# Patient Record
Sex: Male | Born: 1960 | Race: White | Hispanic: No | Marital: Married | Smoking: Former smoker
Health system: Southern US, Community
[De-identification: ages and names within clinical notes are randomized; demographics above are authoritative.]

## PROBLEM LIST (undated history)

## (undated) DIAGNOSIS — Z87898 Personal history of other specified conditions: Secondary | ICD-10-CM

## (undated) DIAGNOSIS — I10 Essential (primary) hypertension: Secondary | ICD-10-CM

## (undated) DIAGNOSIS — F988 Other specified behavioral and emotional disorders with onset usually occurring in childhood and adolescence: Secondary | ICD-10-CM

## (undated) DIAGNOSIS — M722 Plantar fascial fibromatosis: Secondary | ICD-10-CM

## (undated) DIAGNOSIS — N529 Male erectile dysfunction, unspecified: Secondary | ICD-10-CM

## (undated) DIAGNOSIS — R351 Nocturia: Secondary | ICD-10-CM

## (undated) DIAGNOSIS — Z8739 Personal history of other diseases of the musculoskeletal system and connective tissue: Secondary | ICD-10-CM

## (undated) DIAGNOSIS — Z8546 Personal history of malignant neoplasm of prostate: Secondary | ICD-10-CM

## (undated) DIAGNOSIS — F1020 Alcohol dependence, uncomplicated: Secondary | ICD-10-CM

## (undated) HISTORY — DX: Other specified behavioral and emotional disorders with onset usually occurring in childhood and adolescence: F98.8

## (undated) HISTORY — DX: Essential (primary) hypertension: I10

## (undated) HISTORY — DX: Alcohol dependence, uncomplicated: F10.20

---

## 1998-03-13 ENCOUNTER — Other Ambulatory Visit: Admission: RE | Admit: 1998-03-13 | Discharge: 1998-03-13 | Payer: Self-pay | Admitting: Gynecology

## 2004-03-04 ENCOUNTER — Emergency Department (HOSPITAL_COMMUNITY): Admission: EM | Admit: 2004-03-04 | Discharge: 2004-03-04 | Payer: Self-pay | Admitting: Emergency Medicine

## 2006-08-29 ENCOUNTER — Ambulatory Visit (HOSPITAL_COMMUNITY): Admission: RE | Admit: 2006-08-29 | Discharge: 2006-08-29 | Payer: Self-pay | Admitting: Urology

## 2006-09-07 ENCOUNTER — Encounter (HOSPITAL_COMMUNITY): Admission: RE | Admit: 2006-09-07 | Discharge: 2006-09-12 | Payer: Self-pay | Admitting: Urology

## 2006-10-11 ENCOUNTER — Encounter (INDEPENDENT_AMBULATORY_CARE_PROVIDER_SITE_OTHER): Payer: Self-pay | Admitting: Specialist

## 2006-10-11 ENCOUNTER — Inpatient Hospital Stay (HOSPITAL_COMMUNITY): Admission: RE | Admit: 2006-10-11 | Discharge: 2006-10-12 | Payer: Self-pay | Admitting: Urology

## 2006-10-11 HISTORY — PX: ROBOT ASSISTED LAPAROSCOPIC RADICAL PROSTATECTOMY: SHX5141

## 2006-10-20 ENCOUNTER — Ambulatory Visit: Payer: Self-pay | Admitting: Oncology

## 2006-10-31 LAB — COMPREHENSIVE METABOLIC PANEL
ALT: 29 U/L (ref 0–53)
AST: 18 U/L (ref 0–37)
Albumin: 4.5 g/dL (ref 3.5–5.2)
BUN: 16 mg/dL (ref 6–23)
CO2: 25 mEq/L (ref 19–32)
Calcium: 9.1 mg/dL (ref 8.4–10.5)
Chloride: 103 mEq/L (ref 96–112)
Potassium: 3.7 mEq/L (ref 3.5–5.3)

## 2006-10-31 LAB — CBC WITH DIFFERENTIAL/PLATELET
Basophils Absolute: 0.1 10*3/uL (ref 0.0–0.1)
EOS%: 5 % (ref 0.0–7.0)
HGB: 15.7 g/dL (ref 13.0–17.1)
MCH: 33.3 pg (ref 28.0–33.4)
MONO#: 0.8 10*3/uL (ref 0.1–0.9)
NEUT#: 3.8 10*3/uL (ref 1.5–6.5)
RDW: 12.4 % (ref 11.2–14.6)
WBC: 7 10*3/uL (ref 4.0–10.0)
lymph#: 2 10*3/uL (ref 0.9–3.3)

## 2006-12-19 ENCOUNTER — Ambulatory Visit (HOSPITAL_COMMUNITY): Admission: RE | Admit: 2006-12-19 | Discharge: 2006-12-19 | Payer: Self-pay | Admitting: Oncology

## 2006-12-21 ENCOUNTER — Ambulatory Visit: Payer: Self-pay | Admitting: Oncology

## 2006-12-26 LAB — CBC WITH DIFFERENTIAL/PLATELET
BASO%: 0.6 % (ref 0.0–2.0)
EOS%: 4.4 % (ref 0.0–7.0)
HCT: 44 % (ref 38.7–49.9)
LYMPH%: 31.5 % (ref 14.0–48.0)
MCH: 33.4 pg (ref 28.0–33.4)
MCHC: 36.1 g/dL — ABNORMAL HIGH (ref 32.0–35.9)
MONO%: 13.7 % — ABNORMAL HIGH (ref 0.0–13.0)
NEUT%: 49.8 % (ref 40.0–75.0)
Platelets: 207 10*3/uL (ref 145–400)

## 2006-12-26 LAB — COMPREHENSIVE METABOLIC PANEL
ALT: 31 U/L (ref 0–53)
AST: 25 U/L (ref 0–37)
Creatinine, Ser: 0.72 mg/dL (ref 0.40–1.50)
Total Bilirubin: 0.5 mg/dL (ref 0.3–1.2)

## 2006-12-26 LAB — LACTATE DEHYDROGENASE: LDH: 172 U/L (ref 94–250)

## 2007-02-06 ENCOUNTER — Ambulatory Visit: Payer: Self-pay | Admitting: Oncology

## 2007-04-04 ENCOUNTER — Ambulatory Visit: Payer: Self-pay | Admitting: Oncology

## 2007-04-16 ENCOUNTER — Ambulatory Visit (HOSPITAL_COMMUNITY): Admission: RE | Admit: 2007-04-16 | Discharge: 2007-04-16 | Payer: Self-pay | Admitting: Oncology

## 2007-04-26 LAB — CBC WITH DIFFERENTIAL/PLATELET
Basophils Absolute: 0 10*3/uL (ref 0.0–0.1)
Eosinophils Absolute: 0.2 10*3/uL (ref 0.0–0.5)
HGB: 14.4 g/dL (ref 13.0–17.1)
MCV: 94.6 fL (ref 81.6–98.0)
MONO%: 11.5 % (ref 0.0–13.0)
NEUT#: 3.4 10*3/uL (ref 1.5–6.5)
Platelets: 215 10*3/uL (ref 145–400)
RDW: 12.7 % (ref 11.2–14.6)

## 2007-04-26 LAB — LACTATE DEHYDROGENASE: LDH: 154 U/L (ref 94–250)

## 2007-04-26 LAB — COMPREHENSIVE METABOLIC PANEL
Albumin: 4.1 g/dL (ref 3.5–5.2)
Alkaline Phosphatase: 87 U/L (ref 39–117)
BUN: 15 mg/dL (ref 6–23)
CO2: 26 mEq/L (ref 19–32)
Calcium: 9.1 mg/dL (ref 8.4–10.5)
Glucose, Bld: 110 mg/dL — ABNORMAL HIGH (ref 70–99)
Potassium: 4.3 mEq/L (ref 3.5–5.3)

## 2007-10-01 ENCOUNTER — Ambulatory Visit (HOSPITAL_COMMUNITY): Admission: RE | Admit: 2007-10-01 | Discharge: 2007-10-01 | Payer: Self-pay | Admitting: Oncology

## 2007-10-03 ENCOUNTER — Ambulatory Visit: Payer: Self-pay | Admitting: Oncology

## 2010-10-24 ENCOUNTER — Encounter (HOSPITAL_COMMUNITY): Payer: Self-pay | Admitting: Oncology

## 2011-02-18 NOTE — Op Note (Signed)
NAMEJOVANIE, VERGE NO.:  [id number]   MEDICAL RECORD NO.:  [id number]          PATIENT TYPE:  INP   LOCATION:  1416                         FACILITY:  [facility]   PHYSICIAN:  Heloise Purpura, MD      DATE OF BIRTH:  May 06, 1961   DATE OF PROCEDURE:  10/11/2006  DATE OF DISCHARGE:                               OPERATIVE REPORT   PREOPERATIVE DIAGNOSIS:  Clinically localized adenocarcinoma of  prostate.   POSTOPERATIVE DIAGNOSIS:  Clinically localized adenocarcinoma of  prostate.   PROCEDURE:  1. Robotic-assisted laparoscopic radical prostatectomy (bilateral      nerve sparing).  2. Bilateral laparoscopic pelvic lymphadenectomy.   SURGEON:  Dr. Heloise Purpura   ASSISTANT:  Dr. Bjorn Pippin   ANESTHESIA:  General.   COMPLICATIONS:  None.   ESTIMATED BLOOD LOSS:  100 mL.   INTRAVENOUS FLUIDS:  1600 mL of lactated Ringer's.   SPECIMENS:  1. Prostate seminal vesicles.  2. Right pelvic lymph nodes.  3. Left pelvic lymph nodes.   DRAINS:  1. A 20-French coude catheter.  2. A #19 Blake pelvic drain.   INDICATION:  Mr. Poynter is a 50 year old gentleman with clinical  stage T1C prostate cancer with a PSA of 4.04 and Gleason score of 3+3 =  6.  On his initial evaluation by Dr. Logan Bores, a CT scan was performed  which did demonstrate some retroperitoneal lymphadenopathy.  He did  undergo further evaluation including a bone scan which was negative for  metastasis.  He also underwent a scrotal ultrasonography and testicular  tumor markers to exclude a possible testicular malignancy.  A  ProstaScint scan was also performed which was indeterminate.  After  discussing the overall situation and findings, the patient elected to  proceed with surgical therapy for his prostate cancer as it did appear  to be unlikely that he would have metastatic prostate cancer.  Potential  risks and benefits of the above procedures were discussed with the  patient, and he  consented.   DESCRIPTION OF PROCEDURE:  The patient was taken to the operating room,  and a general anesthetic was administered.  He was given preoperative  antibiotics, placed in the dorsal lithotomy position, and prepped and  draped in the usual sterile fashion.  Next, a preoperative time-out was  performed.  A Foley catheter was then inserted into the bladder.  A site  was selected 18 cm from the pubic symphysis and just to the left of the  umbilicus for placement of the camera port.  This was placed using a  standard open Hassan technique.  This allowed entry into the peritoneal  cavity under direct vision.  A 12 mm port was then placed, and a  pneumoperitoneum was established.  The 0-degree lens was then used to  inspect the abdomen, and there was no evidence of any intra-abdominal  injuries or other abnormalities.  The remaining ports were then placed.  Bilateral 8 mm robotic ports were placed 16 cm from the pubic symphysis  and 10 cm lateral to the camera port.  An additional 8 mm robotic port  was placed in the far  left lateral abdominal wall.  A 5 mm port was  placed between the camera port, right robotic port.  An additional 12 mm  port was placed in the far right lateral abdominal wall for laparoscopic  assistance.  All ports were placed under direct vision without  difficulty.  The surgical cart was then docked.  With the aid of the  cautery scissors, the bladder was reflected posteriorly allowing entry  in the space of Retzius and identification of the endopelvic fascia.  The endopelvic fascia was then incised from the apex back to the base of  the prostate bilaterally, and the underlying levator muscle fibers were  swept laterally off the prostate.  This helped to isolate the dorsal  venous complex which was then stapled and divided with a 45 mm flex ETS  stapler.  Attention then turned to the bladder neck which was identified  with the aid of Foley catheter manipulation.   The anterior bladder neck  was then incised, thereby exposing the catheter.  The catheter balloon  was deflated, and the catheter was brought into the operative field and  used to retract the prostate anteriorly.  This exposed the posterior  bladder neck which was divided.  Dissection then continued between the  bladder neck and the prostate until the vasa deferentia were identified.  During this dissection, there was noted to be a small opening on the  left posterior aspect of the bladder.  The vasa deferentia were then  identified and were dissected free, divided, and lifted anteriorly.  Seminal vesicles were also dissected free with care to control the  seminal vesicle arterial blood supply.  The seminal vesicles were also  lifted anteriorly.  The space between Denonvilliers fascia and the  anterior rectum was then bluntly developed, thereby isolating the  vascular pedicles of the prostate.  Attention then turned to the  anterior aspect of the prostate, and the lateral prostatic fascia was  incised bilaterally allowing the neurovascular bundles to be swept  laterally and posteriorly off the prostate.  The neurovascular bundles  were swept off the apex of the prostate.  The vascular pedicles were  then ligated with Hem-o-lok clips and sharply divided with cold scissor  dissection above the level of the neurovascular bundles.  This allowed  the prostate to be freed out to the apex.  The urethra was then sharply  divided, allowing the prostate to be disarticulated and placed up into  the abdomen for later removal.  The pelvis was then copiously irrigated,  and hemostasis was ensured.  With irrigation in the pelvis, air was  injected into the rectal catheter, and there was no evidence of a rectal  injury.  Attention then turned to the right pelvic sidewall.  The  fibrofatty tissue between the external iliac vein, confluence of the iliac vessels, obturator nerve, and Cooper's ligament was  dissected free  from the pelvic sidewall.  Hem-o-lok clips were used for lymphostasis  and hemostasis.  The fibrofatty tissue was then removed as a permanent  pathologic specimen.  An identical procedure was then performed on the  contralateral side.  Attention then returned to the pelvis.  The bladder  neck was carefully identified, and the area that had been inadvertently  opened on the posterior left portion of the bladder neck was identified.  The original bladder neck and this small opening were then connected  allowing there to be 1 larger bladder neck.  The ureteral orifices were  easily identified with  the aid of indigo carmine administration and were  well away from the bladder neck.  A 2-0 Vicryl slip-knot was then placed  at the 6 o'clock position between the bladder neck and urethra to  reapproximate these structures.  A double-armed 3-0 Monocryl suture was  then used to perform a 360 degree running anastomosis between the  bladder neck and urethra.  A new 20-French coude catheter was then  inserted into the bladder and irrigated.  The anastomosis appeared to be  watertight, and there were no blood clots within the bladder.  A #19  Blake drain was then brought through the left robotic port and  appropriately positioned in the pelvis.  It was secured to the skin with  a nylon suture.  The surgical cart was then undocked.  The Endopouch  retrieval bag was then used to retrieve the prostate specimen via the  periumbilical incision.  Zero Vicryl suture was then used to close the  right lateral 12 mm port site with the aid of the port closure suture  device.  All remaining ports were then removed under direct vision.  The  prostate specimen was then removed intact within the Endopouch retrieval  bag via the periumbilical port site.  This fascial opening was then  closed with a running 0 Vicryl suture.  All port sites were then  injected with 0.25% Marcaine and reapproximated at the  skin level with  staples.  Sterile dressings were applied.  The patient appeared to  tolerate the procedure well and without complications.  He was able to  be extubated and transferred to the recovery unit in satisfactory  condition.           ______________________________  Heloise Purpura, MD  Electronically Signed     LB/MEDQ  D:  10/11/2006  T:  10/11/2006  Job:  [id number]

## 2011-02-18 NOTE — Discharge Summary (Signed)
Adam Fox, Adam Fox NO.:  [id number]   MEDICAL RECORD NO.:  [id number]          PATIENT TYPE:  INP   LOCATION:  1416                         FACILITY:  [facility]   PHYSICIAN:  Heloise Purpura, MD      DATE OF BIRTH:  08/11/1961   DATE OF ADMISSION:  10/11/2006  DATE OF DISCHARGE:  10/12/2006                               DISCHARGE SUMMARY   ADMISSION DIAGNOSIS:  Prostate cancer.   DISCHARGE DIAGNOSIS:  Same.   HISTORY AND PHYSICAL:  For full details, please see admission history  and physical.  Briefly Adam Fox is a 50 year old gentleman with recently  diagnosed prostate cancer.  He was felt to most likely have clinically  localized prostate cancer, although did undergo a further workup based  on some incidentally retroperitoneal lymph nodes which were identified  on a CT scan.  He was not felt to have any clear evidence of metastatic  prostate cancer, and therefore elected to proceed with surgical therapy.   HOSPITAL COURSE:  On October 11, 2006, the patient was taken to the  operating room and underwent a robotic-assisted laparoscopic radical  prostatectomy and bilateral pelvic lymphadenectomy.  He tolerated this  procedure well and without complications.  Postoperatively, he was able  to be transferred to a regular hospital room following recovery from  anesthesia.  He was able to begin ambulating the night of surgery.  On  postoperative day 1, he began a clear liquid diet which he tolerated  without problems.  He was able to continue ambulating without  difficulty.  His labs indicated a stable hemoglobin.  In addition, he  maintained excellent urine output, with minimal output from his pelvic  drain.  Therefore, his drain was able to be removed.  He was able to be  discharged home in excellent condition on postoperative day 1.   DISPOSITION:  Home.   DISCHARGE MEDICATIONS:  1. He he was instructed to resume his regular home medications as      before  surgery, excepting any aspirin, nonsteroidal anti-      inflammatory drugs, or herbal supplements.  2. He was given a prescription to take Vicodin as needed for pain, and      told to use Colace as a stool softener.  3. He was also given a prescription to take Cipro beginning 1 day      prior to his return visit for Foley catheter removal.   DISCHARGE INSTRUCTIONS:  The patient was instructed to be ambulatory,  but told to specifically refrain from any heavy lifting, strenuous  activity, or driving.  He was given instructions on Foley catheter care  and a leg bag for daytime usage.   FOLLOWUP:  Adam Fox will follow-up in 1 week for removal of his Foley  catheter, as well as to review his surgical pathology in detail.  When  also will discuss further evaluation of his retroperitoneal  lymphadenopathy and likely will have him see medical oncology for this  further evaluation.           ______________________________  Heloise Purpura, MD  Electronically Signed     LB/MEDQ  D:  10/12/2006  T:  10/12/2006  Job:  [id number]

## 2011-02-18 NOTE — H&P (Signed)
NAMEANDRAY, ASSEFA NO.:  Fox   MEDICAL RECORD NO.:  [id number]          PATIENT TYPE:  INP   LOCATION:  NA                           FACILITY:  [facility]   PHYSICIAN:  Heloise Purpura, MD      DATE OF BIRTH:  May 18, 1961   DATE OF ADMISSION:  10/11/2006  DATE OF DISCHARGE:                              HISTORY & PHYSICAL   CHIEF COMPLAINT:  Prostate cancer.   HISTORY:  Adam Fox is a 50 year old gentleman who was found to have  clinical stage T1C  prostate cancer with a PSA of 4.04 and Gleason score  of 3 + 3 equals 6.  He did undergo a CT scan by Dr. Logan Bores, on his  initial workup, which did demonstrate some retroperitoneal  lymphadenopathy.  He had no evidence of bone metastasis on a subsequent  bone scan.  Due to his lymphadenopathy, he did undergo a further  evaluation including a scrotal ultrasound testicular tumor markers, Adam  well Adam examination by ProstaScint scan which was nonspecific.  Overall,  he was felt to unlikely have metastatic prostate cancer; and therefore  wished to proceed with surgical therapy for his prostate cancer,  understanding that he would need further evaluation of his  lymphadenopathy.   PAST MEDICAL HISTORY:  Hypertension.   PAST SURGICAL HISTORY:  None.   MEDICATIONS:  Hydrochlorothiazide.   ALLERGIES:  PENICILLIN.   SOCIAL HISTORY:  The patient does drink 5-10 beers per day.  He works Adam  a Music therapist.   FAMILY HISTORY:  The patient's father died at age 65.  His mother died  Adam a young woman in a car accident.   PHYSICAL EXAM:  CONSTITUTIONAL:  A well-nourished, well-developed age-  appropriate male in no acute distress.  CARDIOVASCULAR:  Regular rate and rhythm without obvious murmurs.  LUNGS:  Clear bilaterally.  ABDOMEN:  Soft, nontender, nondistended without abdominal masses.  LYMPH NODE SURVEY:  No cervical, supraclavicular, or inguinal  lymphadenopathy.  BACK:  No CVA tenderness.  DIGITAL RECTAL EXAMINATION:   Prostate is 30 grams without nodularity or  induration.  EXTREMITIES: No edema.   IMPRESSION:  Clinically localized prostate cancer.   PLAN:  Patient will undergo robotic assisted laparoscopic radical  prostatectomy and lymphadenectomy to further stage his disease.  He then  will be admitted to the hospital for routine postoperative care.           ______________________________  Heloise Purpura, MD  Electronically Signed     LB/MEDQ  D:  10/11/2006  T:  10/11/2006  Job:  [id number]

## 2012-01-06 ENCOUNTER — Ambulatory Visit (INDEPENDENT_AMBULATORY_CARE_PROVIDER_SITE_OTHER): Payer: BC Managed Care – PPO | Admitting: Physician Assistant

## 2012-01-06 VITALS — BP 135/89 | HR 78 | Temp 98.3°F | Resp 16 | Ht 76.0 in | Wt 233.0 lb

## 2012-01-06 DIAGNOSIS — J019 Acute sinusitis, unspecified: Secondary | ICD-10-CM

## 2012-01-06 DIAGNOSIS — R05 Cough: Secondary | ICD-10-CM

## 2012-01-06 DIAGNOSIS — F1010 Alcohol abuse, uncomplicated: Secondary | ICD-10-CM

## 2012-01-06 DIAGNOSIS — F1020 Alcohol dependence, uncomplicated: Secondary | ICD-10-CM

## 2012-01-06 DIAGNOSIS — I10 Essential (primary) hypertension: Secondary | ICD-10-CM

## 2012-01-06 MED ORDER — BENZONATATE 100 MG PO CAPS: 100.0000 mg | ORAL_CAPSULE | Freq: Three times a day (TID) | ORAL | Status: AC | PRN

## 2012-01-06 MED ORDER — AZITHROMYCIN 250 MG PO TABS: ORAL_TABLET | ORAL | Status: AC

## 2012-01-06 MED ORDER — ALPRAZOLAM 0.25 MG PO TABS: 0.2500 mg | ORAL_TABLET | Freq: Two times a day (BID) | ORAL | Status: AC | PRN

## 2012-01-06 MED ORDER — IPRATROPIUM BROMIDE 0.06 % NA SOLN: 2.0000 | Freq: Three times a day (TID) | NASAL | Status: DC

## 2012-01-06 NOTE — Progress Notes (Signed)
Patient ID: Adam Fox MRN: [id number], DOB: 1961-07-02, 51 y.o. Date of Encounter: 01/06/2012, 11:33 AM  Primary Physician: No primary provider on file.  Chief Complaint:  Chief Complaint  Patient presents with  . Nasal Congestion    x 2 wks  . Sore Throat    x 1 day    HPI: 51 y.o. year old male presents with a 14 day history of nasal congestion, post nasal drip, sinus pressure, and cough. He also mentions a 1 day history of sore throat. Afebrile. No chills. Nasal congestion thick and green/yellow. Sinus pressure is the worst symptom. Cough is productive secondary to post nasal drip and not associated with time of day. Ears feel full, leading to sensation of muffled hearing. Has tried OTC cold preps without success. No GI complaints. Appetite normal.  Also mentions that he plans to taper down his alcohol consumption. Currently he drinks about 12 beers per night and has been doing so for about the past 30 years. He understands this process must take some time and that he must taper his use.  No recent antibiotics, recent travels, or sick contacts   No leg trauma, sedentary periods, h/o cancer, or tobacco use.  Past Medical History  Diagnosis Date  . Alcoholism   . HTN (hypertension)      Home Meds: Prior to Admission medications   Medication Sig Start Date End Date Taking? Authorizing Provider  hydrochlorothiazide (HYDRODIURIL) 25 MG tablet Take 25 mg by mouth daily.   Yes Historical Provider, MD  ALPRAZolam (XANAX) 0.25 MG tablet Take 1 tablet (0.25 mg total) by mouth 2 (two) times daily as needed. 01/06/12 02/05/12  Raymon Mutton Azlyn Wingler, PA-C  azithromycin (ZITHROMAX Z-PAK) 250 MG tablet 2 tabs po first day, then 1 tab po next 4 days 01/06/12 01/11/12  Raymon Mutton Cleon Thoma, PA-C  benzonatate (TESSALON PERLES) 100 MG capsule Take 1 capsule (100 mg total) by mouth 3 (three) times daily as needed for cough. 01/06/12 01/13/12  Raymon Mutton Syriana Croslin, PA-C  ipratropium (ATROVENT) 0.06 % nasal spray Place 2  sprays into the nose 3 (three) times daily. 01/06/12 01/05/13  Sondra Barges, PA-C    Allergies:  Allergies  Allergen Reactions  . Penicillins     Childhood allergy     History   Social History  . Marital Status: Married    Spouse Name: N/A    Number of Children: N/A  . Years of Education: N/A   Occupational History  . Not on file.   Social History Main Topics  . Smoking status: Former Smoker    Quit date: 01/06/2003  . Smokeless tobacco: Not on file  . Alcohol Use: Not on file  . Drug Use: Not on file  . Sexually Active: Not on file   Other Topics Concern  . Not on file   Social History Narrative  . No narrative on file     Review of Systems: Constitutional: negative for chills, fever, night sweats or weight changes Cardiovascular: negative for chest pain or palpitations Respiratory: negative for hemoptysis, wheezing, or shortness of breath Abdominal: negative for abdominal pain, nausea, vomiting or diarrhea Dermatological: negative for rash Neurologic: negative for headache   Physical Exam: Blood pressure 135/89, pulse 78, temperature 98.3 F (36.8 C), temperature source Oral, resp. rate 16, height 6\' 4"  (1.93 m), weight 233 lb (105.688 kg)., Body mass index is 28.36 kg/(m^2). General: Well developed, well nourished, in no acute distress. Head: Normocephalic, atraumatic, eyes without discharge,  sclera non-icteric, nares are congested. Bilateral auditory canals clear, TM's are without perforation, pearly grey with reflective cone of light bilaterally. Serous effusion bilaterally behind TM's. Maxillary sinus TTP. Oral cavity moist, dentition normal. Posterior pharynx with post nasal drip and mild erythema. No peritonsillar abscess or tonsillar exudate. Neck: Supple. No thyromegaly. Full ROM. No lymphadenopathy. Lungs: Clear bilaterally to auscultation without wheezes, rales, or rhonchi. Breathing is unlabored.  Heart: RRR with S1 S2. No murmurs, rubs, or gallops  appreciated. Msk:  Strength and tone normal for age. Extremities: No clubbing or cyanosis. No edema. Neuro: Alert and oriented X 3. Moves all extremities spontaneously. CNII-XII grossly in tact. Psych:  Responds to questions appropriately with a normal affect.     ASSESSMENT AND PLAN:  51 y.o. year old male with sinusitis and alcoholism  1. Sinusitis -Azithromycin 250 MG #6 2 po first day then 1 po next 4 days no RF -Tessalon Perles 100 mg 1 po tid prn cough #30 no RF  -Atrovent NS 0.06% 2 sprays each nare bid prn #1 no RF -Mucinex -Tylenol/Motrin prn -Rest/fluids -RTC precautions -RTC 3-5 days if no improvement  2. Alcoholism  -Xanax 0.25 mg #60 1 po bid prn no RF SED -Taper alcohol use gradually, risks discussed in detail  Signed, Eula Listen, PA-C 01/06/2012 11:33 AM

## 2012-04-22 ENCOUNTER — Ambulatory Visit (INDEPENDENT_AMBULATORY_CARE_PROVIDER_SITE_OTHER): Payer: BC Managed Care – PPO | Admitting: Family Medicine

## 2012-04-22 VITALS — BP 132/80 | HR 16 | Temp 98.6°F | Resp 16 | Ht 74.5 in | Wt 230.0 lb

## 2012-04-22 DIAGNOSIS — M79673 Pain in unspecified foot: Secondary | ICD-10-CM

## 2012-04-22 DIAGNOSIS — M7989 Other specified soft tissue disorders: Secondary | ICD-10-CM

## 2012-04-22 DIAGNOSIS — M109 Gout, unspecified: Secondary | ICD-10-CM

## 2012-04-22 DIAGNOSIS — M79609 Pain in unspecified limb: Secondary | ICD-10-CM

## 2012-04-22 LAB — URIC ACID: Uric Acid, Serum: 7.5 mg/dL (ref 4.0–7.8)

## 2012-04-22 MED ORDER — COLCHICINE 0.6 MG PO TABS: 0.6000 mg | ORAL_TABLET | Freq: Every day | ORAL | Status: DC

## 2012-04-22 NOTE — Progress Notes (Signed)
51 year old White male is her today with complaints of left big toe pain. Pt states he has no history of Gout. Pt also has complaints of feet pain - plantar fascitis for the past five to six months. Pt states he has no other complaints.

## 2012-04-22 NOTE — Progress Notes (Addendum)
[facility]:  Office Visit  Chief Complaint:  Chief Complaint  Patient presents with  . Toe Pain    L big toe x last night  . Foot Pain    Both - Plantar Fasicitis? x 5-6 mths    HPI: Adam Fox is a 51 y.o. male who complains of  1 day h/o Left foot pain ( left 1st toe) and chronic right foot pain on PF. HE is on HCTZ. No h/o gout. Does drink about 10 beers per day. Does not consider himself an alcoholic. He does not eat other gout inducing foods.   Past Medical History  Diagnosis Date  . Alcoholism   . HTN (hypertension)    History reviewed. No pertinent past surgical history. History   Social History  . Marital Status: Married    Spouse Name: N/A    Number of Children: N/A  . Years of Education: N/A   Social History Main Topics  . Smoking status: Former Smoker    Quit date: 01/06/2003  . Smokeless tobacco: None  . Alcohol Use: Yes  . Drug Use: No  . Sexually Active: None   Other Topics Concern  . None   Social History Narrative  . None   No family history on file. Allergies  Allergen Reactions  . Penicillins     Childhood allergy    Prior to Admission medications   Medication Sig Start Date End Date Taking? Authorizing Provider  lisinopril-hydrochlorothiazide (PRINZIDE,ZESTORETIC) 20-12.5 MG per tablet Take 1 tablet by mouth daily.   Yes Historical Provider, MD  hydrochlorothiazide (HYDRODIURIL) 25 MG tablet Take 25 mg by mouth daily.    Historical Provider, MD  ipratropium (ATROVENT) 0.06 % nasal spray Place 2 sprays into the nose 3 (three) times daily. 01/06/12 01/05/13  Raymon Mutton Dunn, PA-C     ROS: The patient denies fevers, chills, night sweats, unintentional weight loss, chest pain, palpitations, wheezing, dyspnea on exertion, nausea, vomiting, abdominal pain, dysuria, hematuria, melena, numbness, weakness, or tingling. Denies loss of bowel or bladder function.   All other systems have been reviewed and were otherwise negative  with the exception of those mentioned in the HPI and as above.    PHYSICAL EXAM: Filed Vitals:   04/22/12 1114  BP: 132/80  Pulse: 16  Temp: 98.6 F (37 C)  Resp: 16   Filed Vitals:   04/22/12 1114  Height: 6' 2.5" (1.892 m)  Weight: 230 lb (104.327 kg)   Body mass index is 29.14 kg/(m^2).  General: Alert, no acute distress HEENT:  Normocephalic, atraumatic Cardiovascular:  No pedal edema. RRR, No MRG Respiratory:  No cyanosis, no use of accessory musculature. CTAB Skin: No rashes. Neurologic: Facial musculature symmetric. Psychiatric: Patient is appropriate throughout our interaction. Lymphatic: No cervical lymphadenopathy Musculoskeletal: Gait intact. Left 1st toe: 5/5 strength, + redness, sentation intact, ROM intact, + DP, + erythema, no warmth    LABS: Results for orders placed in visit on 04/04/07  CBC WITH DIFFERENTIAL      Component Value Range   WBC 5.9  4.0 - 10.0 10e3/uL   NEUT# 3.4  1.5 - 6.5 10e3/uL   HGB 14.4  13.0 - 17.1 g/dL   HCT 91.4  78.2 - 95.6 %   Platelets 215  145 - 400 10e3/uL   MCV 94.6  81.6 - 98.0 fL   MCH 34.0 (*) 28.0 - 33.4 pg   MCHC 36.0 (*) 32.0 - 35.9 g/dL   RBC 2.13  4.20 - 5.71 10e6/uL   RDW 12.7  11.2 - 14.6 %   lymph# 1.6  0.9 - 3.3 10e3/uL   MONO# 0.7  0.1 - 0.9 10e3/uL   Eosinophils Absolute 0.2  0.0 - 0.5 10e3/uL   Basophils Absolute 0.0  0.0 - 0.1 10e3/uL   NEUT% 58.0  40.0 - 75.0 %   LYMPH% 26.6  14.0 - 48.0 %   MONO% 11.5  0.0 - 13.0 %   EOS% 3.3  0.0 - 7.0 %   BASO% 0.6  0.0 - 2.0 %  COMPREHENSIVE METABOLIC PANEL      Component Value Range   Sodium 141  135 - 145 mEq/L   Potassium 4.3  3.5 - 5.3 mEq/L   Chloride 104  96 - 112 mEq/L   CO2 26  19 - 32 mEq/L   Glucose, Bld 110 (*) 70 - 99 mg/dL   BUN 15  6 - 23 mg/dL   Creatinine, Ser 5.78  0.40 - 1.50 mg/dL   Total Bilirubin 0.5  0.3 - 1.2 mg/dL   Alkaline Phosphatase 87  39 - 117 U/L   AST 21  0 - 37 U/L   ALT 31  0 - 53 U/L   Total Protein 7.0  6.0 - 8.3  g/dL   Albumin 4.1  3.5 - 5.2 g/dL   Calcium 9.1  8.4 - 46.9 mg/dL  LACTATE DEHYDROGENASE      Component Value Range   LDH 154  94 - 250 U/L     EKG/XRAY:   Primary read interpreted by Dr. Conley Rolls at [facility].   ASSESSMENT/PLAN: Encounter Diagnoses  Name Primary?  Marland Kitchen Foot pain/swelling   .    Marland Kitchen Gout Yes   1. ? Gout of left great toe Patient has no h/o of gout but is on HCTZ, will get uric acid level. If uric acid is elevated and he continues to have toe pain w/wout recurrence then will consider changing HCTZ Rx Colchicine 1.2 mg x 1 then 0.6 mg daily until pain resolves and/or get nausea/vomiting, diarrhea sxs.  Uric acid lab pending     Adam Blomquist PHUONG, DO 04/22/2012 11:43 AM

## 2012-04-23 ENCOUNTER — Encounter: Payer: Self-pay | Admitting: Family Medicine

## 2014-01-10 ENCOUNTER — Other Ambulatory Visit: Payer: Self-pay | Admitting: Urology

## 2014-01-13 ENCOUNTER — Other Ambulatory Visit: Payer: Self-pay | Admitting: Urology

## 2014-01-24 ENCOUNTER — Encounter (HOSPITAL_BASED_OUTPATIENT_CLINIC_OR_DEPARTMENT_OTHER): Payer: Self-pay | Admitting: *Deleted

## 2014-01-24 NOTE — Progress Notes (Signed)
NPO AFTER MN. ARRIVE AT 0600.  NEEDS ISTAT AND EKG.  PT ADVISED TO ABSTAIN FROM MARIJUANA AND CUT BACK ON ALCOHOL.

## 2014-01-27 ENCOUNTER — Encounter (HOSPITAL_BASED_OUTPATIENT_CLINIC_OR_DEPARTMENT_OTHER)
Admission: RE | Disposition: A | Discharge status: Home / Self Care | Payer: Self-pay | Source: Ambulatory Visit | Attending: Urology

## 2014-01-27 ENCOUNTER — Ambulatory Visit (HOSPITAL_BASED_OUTPATIENT_CLINIC_OR_DEPARTMENT_OTHER)
Admission: RE | Admit: 2014-01-27 | Discharge: 2014-01-28 | Disposition: A | Discharge status: Home / Self Care | Payer: No Typology Code available for payment source | Source: Ambulatory Visit | Attending: Urology | Admitting: Urology

## 2014-01-27 ENCOUNTER — Encounter (HOSPITAL_BASED_OUTPATIENT_CLINIC_OR_DEPARTMENT_OTHER): Payer: Self-pay | Admitting: *Deleted

## 2014-01-27 ENCOUNTER — Ambulatory Visit (HOSPITAL_BASED_OUTPATIENT_CLINIC_OR_DEPARTMENT_OTHER): Payer: No Typology Code available for payment source | Admitting: Anesthesiology

## 2014-01-27 ENCOUNTER — Other Ambulatory Visit: Payer: Self-pay

## 2014-01-27 ENCOUNTER — Encounter (HOSPITAL_BASED_OUTPATIENT_CLINIC_OR_DEPARTMENT_OTHER): Payer: No Typology Code available for payment source | Admitting: Anesthesiology

## 2014-01-27 DIAGNOSIS — Z79899 Other long term (current) drug therapy: Secondary | ICD-10-CM | POA: Insufficient documentation

## 2014-01-27 DIAGNOSIS — Z87891 Personal history of nicotine dependence: Secondary | ICD-10-CM | POA: Insufficient documentation

## 2014-01-27 DIAGNOSIS — N529 Male erectile dysfunction, unspecified: Secondary | ICD-10-CM | POA: Insufficient documentation

## 2014-01-27 DIAGNOSIS — Z8546 Personal history of malignant neoplasm of prostate: Secondary | ICD-10-CM | POA: Insufficient documentation

## 2014-01-27 DIAGNOSIS — N5231 Erectile dysfunction following radical prostatectomy: Secondary | ICD-10-CM

## 2014-01-27 DIAGNOSIS — Z880 Allergy status to penicillin: Secondary | ICD-10-CM | POA: Insufficient documentation

## 2014-01-27 DIAGNOSIS — I10 Essential (primary) hypertension: Secondary | ICD-10-CM | POA: Insufficient documentation

## 2014-01-27 DIAGNOSIS — Z9079 Acquired absence of other genital organ(s): Secondary | ICD-10-CM | POA: Insufficient documentation

## 2014-01-27 HISTORY — DX: Personal history of malignant neoplasm of prostate: Z85.46

## 2014-01-27 HISTORY — DX: Personal history of other diseases of the musculoskeletal system and connective tissue: Z87.39

## 2014-01-27 HISTORY — DX: Male erectile dysfunction, unspecified: N52.9

## 2014-01-27 HISTORY — DX: Plantar fascial fibromatosis: M72.2

## 2014-01-27 HISTORY — PX: PENILE PROSTHESIS IMPLANT: SHX240

## 2014-01-27 HISTORY — DX: Personal history of other specified conditions: Z87.898

## 2014-01-27 HISTORY — DX: Nocturia: R35.1

## 2014-01-27 LAB — POCT I-STAT, CHEM 8
BUN: 14 mg/dL (ref 6–23)
Calcium, Ion: 1.2 mmol/L (ref 1.12–1.23)
Chloride: 105 mEq/L (ref 96–112)
Creatinine, Ser: 0.8 mg/dL (ref 0.50–1.35)
Glucose, Bld: 107 mg/dL — ABNORMAL HIGH (ref 70–99)
HCT: 43 % (ref 39.0–52.0)
Hemoglobin: 14.6 g/dL (ref 13.0–17.0)
Potassium: 3.8 mEq/L (ref 3.7–5.3)
Sodium: 138 mEq/L (ref 137–147)
TCO2: 25 mmol/L (ref 0–100)

## 2014-01-27 SURGERY — INSERTION, PENILE PROSTHESIS, INFLATABLE
Anesthesia: General | Site: Penis

## 2014-01-27 MED ORDER — HYDROMORPHONE HCL PF 1 MG/ML IJ SOLN
INTRAMUSCULAR | Status: AC
  Filled 2014-01-27: qty 1

## 2014-01-27 MED ORDER — OXYCODONE HCL 5 MG PO TABS
5.0000 mg | ORAL_TABLET | ORAL | Status: DC | PRN
  Administered 2014-01-27 (×2): 5 mg via ORAL
  Filled 2014-01-27: qty 1

## 2014-01-27 MED ORDER — LACTATED RINGERS IV SOLN
INTRAVENOUS | Status: DC
  Administered 2014-01-27: 07:00:00 via INTRAVENOUS
  Filled 2014-01-27: qty 1000

## 2014-01-27 MED ORDER — CEFAZOLIN SODIUM-DEXTROSE 2-3 GM-% IV SOLR
2.0000 g | Freq: Three times a day (TID) | INTRAVENOUS | Status: DC
  Administered 2014-01-27 – 2014-01-28 (×3): 2 g via INTRAVENOUS
  Filled 2014-01-27: qty 50

## 2014-01-27 MED ORDER — CIPROFLOXACIN HCL 500 MG PO TABS: 500.0000 mg | ORAL_TABLET | Freq: Two times a day (BID) | ORAL | Status: DC

## 2014-01-27 MED ORDER — PROMETHAZINE HCL 25 MG/ML IJ SOLN
6.2500 mg | INTRAMUSCULAR | Status: DC | PRN
  Filled 2014-01-27: qty 1

## 2014-01-27 MED ORDER — ACETAMINOPHEN 10 MG/ML IV SOLN
INTRAVENOUS | Status: DC | PRN
  Administered 2014-01-27: 1000 mg via INTRAVENOUS

## 2014-01-27 MED ORDER — OXYCODONE HCL 5 MG PO TABS
ORAL_TABLET | ORAL | Status: AC
  Filled 2014-01-27: qty 1

## 2014-01-27 MED ORDER — BUPIVACAINE-EPINEPHRINE 0.5% -1:200000 IJ SOLN
INTRAMUSCULAR | Status: DC | PRN
  Administered 2014-01-27: 10 mL

## 2014-01-27 MED ORDER — FENTANYL CITRATE 0.05 MG/ML IJ SOLN
INTRAMUSCULAR | Status: DC | PRN
  Administered 2014-01-27: 50 ug via INTRAVENOUS
  Administered 2014-01-27: 25 ug via INTRAVENOUS
  Administered 2014-01-27 (×2): 50 ug via INTRAVENOUS
  Administered 2014-01-27: 25 ug via INTRAVENOUS

## 2014-01-27 MED ORDER — HYDROMORPHONE HCL PF 1 MG/ML IJ SOLN
0.5000 mg | INTRAMUSCULAR | Status: DC | PRN
  Administered 2014-01-27: 0.5 mg via INTRAVENOUS
  Administered 2014-01-27: 1 mg via INTRAVENOUS
  Administered 2014-01-27: 0.5 mg via INTRAVENOUS
  Administered 2014-01-28: 1 mg via INTRAVENOUS
  Filled 2014-01-27: qty 1

## 2014-01-27 MED ORDER — LIDOCAINE HCL (CARDIAC) 20 MG/ML IV SOLN
INTRAVENOUS | Status: DC | PRN
  Administered 2014-01-27: 100 mg via INTRAVENOUS

## 2014-01-27 MED ORDER — SODIUM CHLORIDE 0.9 % IR SOLN
Status: DC | PRN
  Administered 2014-01-27: 08:00:00

## 2014-01-27 MED ORDER — PROPOFOL 10 MG/ML IV BOLUS
INTRAVENOUS | Status: DC | PRN
  Administered 2014-01-27: 300 mg via INTRAVENOUS

## 2014-01-27 MED ORDER — MIDAZOLAM HCL 5 MG/5ML IJ SOLN
INTRAMUSCULAR | Status: DC | PRN
  Administered 2014-01-27: 2 mg via INTRAVENOUS

## 2014-01-27 MED ORDER — ACETAMINOPHEN 10 MG/ML IV SOLN
1000.0000 mg | Freq: Four times a day (QID) | INTRAVENOUS | Status: AC
  Administered 2014-01-27 – 2014-01-28 (×4): 1000 mg via INTRAVENOUS
  Filled 2014-01-27: qty 100

## 2014-01-27 MED ORDER — ONDANSETRON HCL 4 MG/2ML IJ SOLN
INTRAMUSCULAR | Status: DC | PRN
  Administered 2014-01-27: 4 mg via INTRAVENOUS

## 2014-01-27 MED ORDER — VANCOMYCIN HCL IN DEXTROSE 1-5 GM/200ML-% IV SOLN
1000.0000 mg | Freq: Once | INTRAVENOUS | Status: AC
  Administered 2014-01-27: 1000 mg via INTRAVENOUS
  Filled 2014-01-27: qty 200

## 2014-01-27 MED ORDER — DEXAMETHASONE SODIUM PHOSPHATE 4 MG/ML IJ SOLN
INTRAMUSCULAR | Status: DC | PRN
  Administered 2014-01-27: 10 mg via INTRAVENOUS

## 2014-01-27 MED ORDER — MIDAZOLAM HCL 2 MG/2ML IJ SOLN
INTRAMUSCULAR | Status: AC
  Filled 2014-01-27: qty 2

## 2014-01-27 MED ORDER — FENTANYL CITRATE 0.05 MG/ML IJ SOLN
INTRAMUSCULAR | Status: AC
  Filled 2014-01-27: qty 4

## 2014-01-27 MED ORDER — GENTAMICIN SULFATE 40 MG/ML IJ SOLN
460.0000 mg | INTRAVENOUS | Status: AC
  Administered 2014-01-27: 460 mg via INTRAVENOUS
  Filled 2014-01-27 (×2): qty 11.5

## 2014-01-27 MED ORDER — ONDANSETRON HCL 4 MG/2ML IJ SOLN
4.0000 mg | INTRAMUSCULAR | Status: DC | PRN
  Filled 2014-01-27: qty 2

## 2014-01-27 MED ORDER — SODIUM CHLORIDE 0.9 % IV SOLN
INTRAVENOUS | Status: DC
  Administered 2014-01-27 – 2014-01-28 (×2): via INTRAVENOUS
  Filled 2014-01-27: qty 1000

## 2014-01-27 MED ORDER — HYDROMORPHONE HCL PF 1 MG/ML IJ SOLN
0.2500 mg | INTRAMUSCULAR | Status: DC | PRN
  Filled 2014-01-27: qty 1

## 2014-01-27 MED ORDER — BACITRACIN-NEOMYCIN-POLYMYXIN OINTMENT TUBE
TOPICAL_OINTMENT | CUTANEOUS | Status: DC | PRN
  Administered 2014-01-27: 1 via TOPICAL

## 2014-01-27 MED ORDER — HYDROMORPHONE HCL 4 MG PO TABS: 4.0000 mg | ORAL_TABLET | ORAL | Status: DC | PRN

## 2014-01-27 SURGICAL SUPPLY — 78 items
ADH SKN CLS APL DERMABOND .7 (GAUZE/BANDAGES/DRESSINGS)
APL SKNCLS STERI-STRIP NONHPOA (GAUZE/BANDAGES/DRESSINGS) ×1
APPLICATOR COTTON TIP 6IN STRL (MISCELLANEOUS) IMPLANT
BAG DECANTER FOR FLEXI CONT (MISCELLANEOUS) ×1 IMPLANT
BAG URINE DRAINAGE (UROLOGICAL SUPPLIES) ×2 IMPLANT
BANDAGE CO FLEX L/F 2IN X 5YD (GAUZE/BANDAGES/DRESSINGS) IMPLANT
BANDAGE GAUZE ELAST BULKY 4 IN (GAUZE/BANDAGES/DRESSINGS) ×1 IMPLANT
BENZOIN TINCTURE PRP APPL 2/3 (GAUZE/BANDAGES/DRESSINGS) ×2 IMPLANT
BLADE HEX COATED 2.75 (ELECTRODE) ×2 IMPLANT
BLADE SURG 15 STRL LF DISP TIS (BLADE) ×2 IMPLANT
BLADE SURG 15 STRL SS (BLADE) ×4
BLADE SURG ROTATE 9660 (MISCELLANEOUS) ×1 IMPLANT
BNDG GAUZE ELAST 4 BULKY (GAUZE/BANDAGES/DRESSINGS) ×2 IMPLANT
CANISTER SUCTION 1200CC (MISCELLANEOUS) IMPLANT
CANISTER SUCTION 2500CC (MISCELLANEOUS) ×1 IMPLANT
CATH FOLEY 2WAY SLVR  5CC 16FR (CATHETERS) ×1
CATH FOLEY 2WAY SLVR 5CC 16FR (CATHETERS) ×1 IMPLANT
CHLORAPREP W/TINT 26ML (MISCELLANEOUS) ×2 IMPLANT
CLOTH BEACON ORANGE TIMEOUT ST (SAFETY) ×2 IMPLANT
COVER MAYO STAND STRL (DRAPES) ×4 IMPLANT
COVER TABLE BACK 60X90 (DRAPES) ×2 IMPLANT
DERMABOND ADVANCED (GAUZE/BANDAGES/DRESSINGS)
DERMABOND ADVANCED .7 DNX12 (GAUZE/BANDAGES/DRESSINGS) IMPLANT
DISSECTOR ROUND CHERRY 3/8 STR (MISCELLANEOUS) IMPLANT
DRAPE EXTREMITY T 121X128X90 (DRAPE) ×2 IMPLANT
DRAPE LAPAROTOMY TRNSV 102X78 (DRAPE) IMPLANT
DRSG TEGADERM 4X4.75 (GAUZE/BANDAGES/DRESSINGS) ×2 IMPLANT
ELECT REM PT RETURN 9FT ADLT (ELECTROSURGICAL) ×2
ELECTRODE REM PT RTRN 9FT ADLT (ELECTROSURGICAL) ×1 IMPLANT
GAUZE SPONGE 4X4 12PLY STRL LF (GAUZE/BANDAGES/DRESSINGS) ×2 IMPLANT
GLOVE BIO SURGEON STRL SZ 6.5 (GLOVE) ×1 IMPLANT
GLOVE BIO SURGEON STRL SZ8 (GLOVE) ×3 IMPLANT
GLOVE INDICATOR 7.0 STRL GRN (GLOVE) ×1 IMPLANT
GLOVE SURG SS PI 7.5 STRL IVOR (GLOVE) ×2 IMPLANT
GOWN STRL REUS W/ TWL LRG LVL3 (GOWN DISPOSABLE) IMPLANT
GOWN STRL REUS W/ TWL XL LVL3 (GOWN DISPOSABLE) IMPLANT
GOWN STRL REUS W/TWL LRG LVL3 (GOWN DISPOSABLE) ×2
GOWN STRL REUS W/TWL XL LVL3 (GOWN DISPOSABLE) ×2
HOLDER FOLEY CATH W/STRAP (MISCELLANEOUS) ×2 IMPLANT
IV NS 1000ML (IV SOLUTION) ×2
IV NS 1000ML BAXH (IV SOLUTION) IMPLANT
KIT TITAN ASSEMBLY (Erectile Restoration) ×1 IMPLANT
KIT TITAN ASSEMBLY STANDARD (Erectile Restoration) ×1 IMPLANT
KIT TITAN ASSEMBLY STD (Erectile Restoration) IMPLANT
NS IRRIG 500ML POUR BTL (IV SOLUTION) ×2 IMPLANT
PACK BASIN DAY SURGERY FS (CUSTOM PROCEDURE TRAY) ×2 IMPLANT
PENCIL BUTTON HOLSTER BLD 10FT (ELECTRODE) ×2 IMPLANT
PLUG CATH AND CAP STER (CATHETERS) ×2 IMPLANT
PROS TITAN SCROT 0 ANG 22CM (Erectile Restoration) ×2 IMPLANT
PROSTHESIS TTN SCRO 0 ANG 22CM (Erectile Restoration) IMPLANT
RESERVOIR 75CC LOCKOUT BIOFLEX (Erectile Restoration) ×1 IMPLANT
RETRACTOR WILSON COLOPLAST (INSTRUMENTS) ×1 IMPLANT
RETRACTOR WILSON SYSTEM (INSTRUMENTS) ×1 IMPLANT
SPONGE GAUZE 4X4 12PLY (GAUZE/BANDAGES/DRESSINGS) ×1 IMPLANT
SPONGE LAP 18X18 X RAY DECT (DISPOSABLE) IMPLANT
SPONGE LAP 4X18 X RAY DECT (DISPOSABLE) ×4 IMPLANT
STRIP CLOSURE SKIN 1/2X4 (GAUZE/BANDAGES/DRESSINGS) IMPLANT
SUPPORT SCROTAL LG STRP (MISCELLANEOUS) ×1 IMPLANT
SURGILUBE 2OZ TUBE FLIPTOP (MISCELLANEOUS) ×2 IMPLANT
SUT CHROMIC 3 0 SH 27 (SUTURE) ×5 IMPLANT
SUT MNCRL AB 4-0 PS2 18 (SUTURE) ×2 IMPLANT
SUT PDS AB 2-0 CT2 27 (SUTURE) ×4 IMPLANT
SUT VIC AB 2-0 UR6 27 (SUTURE) IMPLANT
SUT VIC AB 3-0 SH 27 (SUTURE)
SUT VIC AB 3-0 SH 27X BRD (SUTURE) IMPLANT
SUT VICRYL 0 UR6 27IN ABS (SUTURE) ×4 IMPLANT
SUT VICRYL 4-0 PS2 18IN ABS (SUTURE) IMPLANT
SYR 20CC LL (SYRINGE) ×2 IMPLANT
SYR 50ML LL SCALE MARK (SYRINGE) ×4 IMPLANT
SYR BULB IRRIGATION 50ML (SYRINGE) ×2 IMPLANT
SYRINGE 10CC LL (SYRINGE) ×2 IMPLANT
TLC MALE UROLOGY RETRACTOR SYSTEM ×1 IMPLANT
TOWEL OR 17X24 6PK STRL BLUE (TOWEL DISPOSABLE) ×4 IMPLANT
TRAY DSU PREP LF (CUSTOM PROCEDURE TRAY) ×2 IMPLANT
TUBE CONNECTING 12X1/4 (SUCTIONS) ×2 IMPLANT
WATER STERILE IRR 3000ML UROMA (IV SOLUTION) IMPLANT
WATER STERILE IRR 500ML POUR (IV SOLUTION) ×2 IMPLANT
YANKAUER SUCT BULB TIP NO VENT (SUCTIONS) ×2 IMPLANT

## 2014-01-27 NOTE — Anesthesia Preprocedure Evaluation (Addendum)
Anesthesia Evaluation  Patient identified by MRN, date of birth, ID band Patient awake    Reviewed: Allergy & Precautions, H&P , NPO status , Patient's Chart, lab work & pertinent test results  Airway Mallampati: II TM Distance: <3 FB Neck ROM: Full    Dental no notable dental hx.    Pulmonary neg pulmonary ROS, former smoker,  breath sounds clear to auscultation  Pulmonary exam normal       Cardiovascular hypertension, Pt. on medications Rhythm:Regular Rate:Normal     Neuro/Psych negative neurological ROS  negative psych ROS   GI/Hepatic negative GI ROS, (+)     substance abuse  alcohol use,   Endo/Other  negative endocrine ROS  Renal/GU negative Renal ROS  negative genitourinary   Musculoskeletal negative musculoskeletal ROS (+)   Abdominal   Peds negative pediatric ROS (+)  Hematology negative hematology ROS (+)   Anesthesia Other Findings   Reproductive/Obstetrics negative OB ROS                           Anesthesia Physical Anesthesia Plan  ASA: III  Anesthesia Plan: General   Post-op Pain Management:    Induction: Intravenous  Airway Management Planned: LMA  Additional Equipment:   Intra-op Plan:   Post-operative Plan: Extubation in OR  Informed Consent: I have reviewed the patients History and Physical, chart, labs and discussed the procedure including the risks, benefits and alternatives for the proposed anesthesia with the patient or authorized representative who has indicated his/her understanding and acceptance.   Dental advisory given  Plan Discussed with: CRNA and Surgeon  Anesthesia Plan Comments:         Anesthesia Quick Evaluation

## 2014-01-27 NOTE — Transfer of Care (Signed)
Immediate Anesthesia Transfer of Care Note  Patient: Adam Fox  Procedure(s) Performed: Procedure(s): 3 PIECE PENILE PROTHESIS INFLATABLE COLOPLAST (N/A)  Patient Location: PACU  Anesthesia Type:General  Level of Consciousness: awake, alert  and oriented  Airway & Oxygen Therapy: Patient Spontanous Breathing and Patient connected to nasal cannula oxygen  Post-op Assessment: Report given to PACU RN  Post vital signs: Reviewed and stable  Complications: No apparent anesthesia complications

## 2014-01-27 NOTE — H&P (Signed)
[name] is a 53 year old male with erectile dysfunction.   History of Present Illness        Adam Fox is a 53 year old with prostate cancer s/p a BNS RAL radical prostatectomy on October 11, 2006. His PSA has thus far been undetectable.      TNM stage: pT2c N0 Mx   Gleason score: 3+3=6   Surgical margins: Negative   Pretreatment PSA: 4.04   Pretreatment SHIM: 24      Interval history: After his prostatectomy he developed significant erectile dysfunction. He has tried Viagra, Cialis for daily use and several combinations of injection therapy which caused pain but no significant erection.  He had no new medical or surgical problems since I have seen him last.   Past Medical History Problems  1. History of hypertension (V12.59) 2. Prostate cancer (185)  Surgical History Problems  1. History of Prostatect Retropubic Radical W/ Nerve Sparing Laparoscopic  Current Meds 1. Flax Seed Oil CAPS;  Therapy: (Recorded:16Mar2015) to Recorded 2. Ibuprofen TABS; TAKE TABLET  PRN;  Therapy: (Recorded:16Mar2015) to Recorded 3. Lisinopril-Hydrochlorothiazide 20-12.5 MG Oral Tablet;  Therapy: (Recorded:16Mar2015) to Recorded  Allergies Medication  1. Penicillins  Family History Problems  1. Denied: Family history of Prostate Cancer  Social History Problems  1. Alcohol Use   5-10 beers per day 2. Marital History - Currently Married 3. Occupation:   Carpenter 4. Tobacco Use (V15.82)   quit smoking 7 yrs ago  Review of Systems Genitourinary, constitutional, skin, eye, otolaryngeal, hematologic/lymphatic, cardiovascular, pulmonary, endocrine, musculoskeletal, gastrointestinal, neurological and psychiatric system(s) were reviewed and pertinent findings if present are noted.  Genitourinary: Erectile dysfunction  Vitals Vital Signs  Height: 6 ft 2.75 in Weight: 248 lb  BMI Calculated: 31.21 BSA Calculated: 2.4 Blood Pressure: 133 / 78 Temperature: 97.8 F Heart  Rate: 80  Physical Exam Constitutional: Well nourished and well developed . No acute distress.  ENT:. The ears and nose are normal in appearance.  Neck: The appearance of the neck is normal and no neck mass is present.  Pulmonary: No respiratory distress and normal respiratory rhythm and effort.  Cardiovascular: Heart rate and rhythm are normal . No peripheral edema.  Abdomen: The abdomen is soft and nontender. No masses are palpated. No CVA tenderness. No hernias are palpable. No hepatosplenomegaly noted.   Genitourinary: Examination of the penis demonstrates no discharge, no masses, no lesions and a normal meatus. The penis is uncircumcised. The scrotum is without lesions. The right epididymis is palpably normal and non-tender. The left epididymis is palpably normal and non-tender. The right testis is non-tender and without masses. The left testis is non-tender and without masses.  Lymphatics: The femoral and inguinal nodes are not enlarged or tender.  Skin: Normal skin turgor, no visible rash and no visible skin lesions.  Neuro/Psych:. Mood and affect are appropriate.  Assessment      I discussed with him penile prosthesis placement as his last remaining option. We discussed how the device worked in the fact that it is sized to his current corporal length. We discussed the incision used and the fact that a second incision in the suprapubic region may be necessary. I went over the risks and complications associated with this surgery, the probability of success and the need for an overnight stay in the hospital after the surgery as well as the anticipated postoperative course. I have answered all of his questions to his satisfaction. I've also given he was given written information from [facility] including  a DVD at his last visit. He is aware that he device become infected this will very likely result in the need for its entirely normal. He has elected to proceed with three-piece inflatable penile  prosthesis implantation.   Plan  He will schedule for three-piece inflatable penile prosthesis implantation via the penoscrotal approach.

## 2014-01-27 NOTE — Anesthesia Postprocedure Evaluation (Signed)
  Anesthesia Post-op Note  Patient: Adam Fox  Procedure(s) Performed: Procedure(s) (LRB): 3 PIECE PENILE PROTHESIS INFLATABLE COLOPLAST (N/A)  Patient Location: PACU  Anesthesia Type: General  Level of Consciousness: awake and alert   Airway and Oxygen Therapy: Patient Spontanous Breathing  Post-op Pain: mild  Post-op Assessment: Post-op Vital signs reviewed, Patient's Cardiovascular Status Stable, Respiratory Function Stable, Patent Airway and No signs of Nausea or vomiting  Last Vitals:  Filed Vitals:   01/27/14 0936  BP: 160/93  Pulse: 92  Temp: 36.5 C  Resp: 12    Post-op Vital Signs: stable   Complications: No apparent anesthesia complications

## 2014-01-27 NOTE — Op Note (Signed)
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 He has elected to proceed with prosthesis implantation.  ANESTHESIA:  General  EBL:  Minimal  Device: 3 piece Titan: 75 cc reservoir, 22 cm cylinders and 2 cm rear-tip extenders on right and left sides  LOCAL MEDICATIONS USED:  1/2% Marcaine with epinephrine in the scrotal incision.  SPECIMEN: None  DISPOSITION OF SPECIMEN:  N/A  Description of procedure: The patient was taken to the major operating room, placed on the table and administered general anesthesia in the supine position. His genitalia was then scrubbed for 10 minutes, painted and then sterilely draped. An official timeout was then performed. He received preoperative gentamicin and cephalexin IV.  A 16 French Foley catheter was placed in the bladder and the bladder was drained and the catheter was plugged. A midline median raphae scrotal incision was then made and then blunt dissection was used to expose the corpus cavernosum on the right and left sides. This was further cleared of overlying tissue using sharp technique. 2-0 PDS sutures were then placed in the corpus cavernosum to service stay sutures and then an incision was then made in the corpus cavernosum first on the left-hand side with the knife and then extended proximally and distally with the curved Mayo scissors. I then dilated the corpus cavernosum with the Hegar dilators starting at 10 and progressing to 14 proximally and distally. I then irrigated the corpus cavernosum with antibiotic solution and measured the distance proximally and  distally from the stay suture and was found to be 14 cm distally and 10 cm proximally. I then turned my attention to the contralateral corpus cavernosum and placed my stay sutures, made my corporotomy and dilated the corpus cavernosum in an identical fashion. This was measured and also was found to be 14 centimeters distally and 10 cm proximally. It was irrigated with antibiotic solution as was the scrotum. I then chose an 22 cm cylinder set with 2 cm rear-tip extenders and these were prepped while I prepared the site for reservoir placement.  I used blunt technique to dissect up to the external inguinal ring on the right-hand side after the bladder had been completely drained.  I swept the spermatic cord laterally and then was able to bluntly dissect along the floor of the external inguinal ring and then cephalad lateral to the rectus muscle and beneath the transversalis muscle. I was able to palpate tissue beneath my finger in the peritoneal cavity at all times. A nasal speculum was then in situ weighted next my finger and this was used to guide the reservoir into the developed for it. I then filled the reservoir with 75 cc of sterile saline with no back pressure noted.  Attention was redirected to the corporotomies where the cylinders were then placed by first fixing the suture to the distal aspect of the right cylinder to a straight needle. This was then loaded on the [facility] inserter and passed through the corporotomy and distally. I then advanced the straight needle with the Furlow inserter out through the glans and this was grasped with a hemostat and pulled through the glans and the suture was secured with a hemostat. I  then performed an identical maneuver on the contralateral side. After this was performed I irrigated both corpus cavernosum and inserted the distal portion of the cylinder through the corporotomies and pulled this to the end of the corpora with the suture. The proximal aspect with the  rear-tip extender was then passed through the corporotomy and into the seated position on each side. I completely deflated the cylinders and then added 15 cc of sterile saline for a total of 90 cc of fluid in the system. I then connected reservoir tubing to a syringe filled with sterile saline and inflated the device. I noted a good straight erection with both cylinders equidistant under the glans and no buckling of the cylinders.   I closed the corporotomies with running 2-0 Vicryl suture. The cylinder was then connected to the pump after excising the excess tubing with appropriate shodded hemostats in place and then I used the supplied connectors to make the connection. I then again cycled the device with the pump and it cycled properly. I deflated the device and pumped it up about three quarters of the way to aid with hemostasis. I irrigated the scrotum once again with antibiotic solution.  I then developed the scrotal pocket in the right hemiscrotum in place the pump in this location. I irrigated the wound one last time with antibiotic irrigation and then closed the deep scrotal tissue over the tubing and pump with running 3-0 chromic suture. A second layer was then closed over this first layer with running 3-0 chromic and then the skin was closed with running 3-0 chromic. Antibiotic ointment, a 4 x 4 and a Tegaderm were then applied to the scrotum. I then wrapped the scrotum and penis with a Curlex. The catheter was connected to closed system drainage and the patient was awakened and taken recovery room in stable and satisfactory condition. He tolerated the procedure well and there were no intraoperative complications. Needle sponge and instrument counts were correct at the end of the operation.  PLAN OF CARE: He will be observed overnight with intravenous antibiotics and pain medication with plan for discharge in the a.m.   PATIENT DISPOSITION:  PACU - hemodynamically stable.

## 2014-01-27 NOTE — Anesthesia Procedure Notes (Signed)
Procedure Name: LMA Insertion Date/Time: 01/27/2014 7:39 AM Performed by: Maris BergerENENNY, Mariene Dickerman T Pre-anesthesia Checklist: Patient identified, Emergency Drugs available, Suction available and Patient being monitored Patient Re-evaluated:Patient Re-evaluated prior to inductionOxygen Delivery Method: Circle System Utilized Preoxygenation: Pre-oxygenation with 100% oxygen Intubation Type: IV induction Ventilation: Mask ventilation without difficulty LMA: LMA inserted LMA Size: 5.0 Number of attempts: 1 Airway Equipment and Method: bite block Placement Confirmation: positive ETCO2 Dental Injury: Teeth and Oropharynx as per pre-operative assessment

## 2014-01-27 NOTE — Progress Notes (Signed)
Patient ID: Adam Fox, male   DOB: 09/20/1961, 53 y.o.   MRN: [id number]  He was seen postoperatively and was doing well.  He was mildly sore but not having any severe pain.  He had no nausea and was tolerating regular diet.  AVSS Urine clear Abdomen slightly tender in the right lower quadrant as expected Penoscrotal dressing in place.  Imp: Doing well postoperatively from penile prosthesis implantation.  Plan:  1. Continue antibiotics intravenously until discharge. 2.  Foley catheter out in the a.m. 3.  Anticipated discharge  in the morning.

## 2014-01-28 ENCOUNTER — Encounter (HOSPITAL_BASED_OUTPATIENT_CLINIC_OR_DEPARTMENT_OTHER): Payer: Self-pay | Admitting: Urology

## 2014-01-28 MED ORDER — HYDROMORPHONE HCL PF 1 MG/ML IJ SOLN
INTRAMUSCULAR | Status: AC
  Filled 2014-01-28: qty 1

## 2014-01-28 MED ORDER — HYDROMORPHONE HCL 2 MG PO TABS
2.0000 mg | ORAL_TABLET | ORAL | Status: DC | PRN
Start: 2014-01-28 — End: 2014-01-28
  Administered 2014-01-28: 2 mg via ORAL
  Filled 2014-01-28: qty 1

## 2014-01-28 MED ORDER — HYDROMORPHONE HCL 2 MG PO TABS
ORAL_TABLET | ORAL | Status: AC
  Filled 2014-01-28: qty 1

## 2014-01-28 NOTE — Discharge Instructions (Signed)
Penile prosthesis postoperative instructions  Wound:  In most cases your incision will have absorbable sutures that will dissolve within the first 10-20 days. Some will fall out even earlier. Expect some redness as the sutures dissolved but this should occur only around the sutures. If there is generalized redness, especially with increasing pain or swelling, let us know. The scrotum and penis will very likely get "black and blue" as the blood in the tissues spread. Sometimes the whole scrotum will turn colors. The black and blue is followed by a yellow and brown color. In time, all the discoloration will go away. In some cases some firm swelling in the area of the testicle and pump may persist for up to 4-6 weeks after the surgery and is considered normal in most cases.  Diet:  You may return to your normal diet within 24 hours following your surgery. You may note some mild nausea and possibly vomiting the first 6-8 hours following surgery. This is usually due to the side effects of anesthesia, and will disappear quite soon. I would suggest clear liquids and a very light meal the first evening following your surgery.  Activity:  Your physical activity should be restricted the first 48 hours. During that time you should remain relatively inactive, moving about only when necessary. During the first 7-10 days following surgery he should avoid lifting any heavy objects (anything greater than 15 pounds), and avoid strenuous exercise. If you work, ask us specifically about your restrictions, both for work and home. We will write a note to your employer if needed.  You should plan to wear a tight pair of jockey shorts or an athletic supporter for the first 4-5 days, even to sleep. This will keep the scrotum immobilized to some degree and keep the swelling down.The position of your penis will determine what is most comfortable but I strongly urge you to keep the penis in the "up" position (toward your  head).  Ice packs should be placed on and off over the scrotum for the first 48 hours. Frozen peas or corn in a ZipLock bag can be frozen, used and re-frozen. Fifteen minutes on and 15 minutes off is a reasonable schedule. The ice is a good pain reliever and keeps the swelling down.  Hygiene:  You may shower 48 hours after your surgery. Tub bathing should be restricted until the seventh day.  Medication:  You will be sent home with some type of pain medication. In many cases you will be sent home with a narcotic pain pill (hydrocodone or oxycodone). If the pain is not too bad, you may take either Tylenol (acetaminophen) or Advil (ibuprofen) which contain no narcotic agents, and might be tolerated a little better, with fewer side effects. If the pain medication you are sent home with does not control the pain, you will have to let us know. Some narcotic pain medications cannot be given or refilled by a phone call to a pharmacy.  Problems you should report to us:   Fever of 101.0 degrees Fahrenheit or greater.  Moderate or severe swelling under the skin incision or involving the scrotum. Drug reaction such as hives, a rash, nausea or vomiting. Post Anesthesia Home Care Instructions  Activity: Get plenty of rest for the remainder of the day. A responsible adult should stay with you for 24 hours following the procedure.  For the next 24 hours, DO NOT: -Drive a car -Advertising copywriterperate machinery -Drink alcoholic beverages -Take any medication unless instructed by your  physician -Make any legal decisions or sign important papers.  Meals: Start with liquid foods such as gelatin or soup. Progress to regular foods as tolerated. Avoid greasy, spicy, heavy foods. If nausea and/or vomiting occur, drink only clear liquids until the nausea and/or vomiting subsides. Call your physician if vomiting continues.  Special Instructions/Symptoms: Your throat may feel dry or sore from the anesthesia or the breathing  tube placed in your throat during surgery. If this causes discomfort, gargle with warm salt water. The discomfort should disappear within 24 hours.

## 2014-01-28 NOTE — Discharge Summary (Signed)
Physician Discharge Summary  Patient ID: Adam Fox MRN: [id number] DOB/AGE: 52/01/1961 52 y.o.  Admit date: 01/27/2014 Discharge date: 01/28/2014  Admission Diagnoses: Erectile dysfunction following radical prostatectomy  Discharge Diagnoses:  Active Problems:   Erectile dysfunction following radical prostatectomy   Discharged Condition: good  Hospital Course: He was admitted for elective inflatable penile prosthesis implantation and underwent that surgery yesterday without complication. He did well overnight. He's not having any significant pain. His dressing remains intact and he is tolerating a regular diet. His catheter is then removed.  Discharge Exam: Blood pressure 141/78, pulse 84, temperature 97.5 F (36.4 C), temperature source Oral, resp. rate 18, height 6\' 3"  (1.905 m), weight 109.77 kg (242 lb), SpO2 96.00%. He has a dry, intact dressing. The glans appears normal. He is mildly tender in the lower abdomen where his reservoir was implanted.  Disposition:   Discharge Orders   Future Orders Complete By Expires   Discharge patient  As directed        Medication List    TAKE these medications       ciprofloxacin 500 MG tablet  Commonly known as:  CIPRO  Take 1 tablet (500 mg total) by mouth 2 (two) times daily.     HYDROmorphone 4 MG tablet  Commonly known as:  DILAUDID  Take 1 tablet (4 mg total) by mouth every 4 (four) hours as needed.     ibuprofen 200 MG tablet  Commonly known as:  ADVIL,MOTRIN  Take 200 mg by mouth every 6 (six) hours as needed.     lisinopril-hydrochlorothiazide 20-12.5 MG per tablet  Commonly known as:  PRINZIDE,ZESTORETIC  Take 1 tablet by mouth daily.      ASK your doctor about these medications       colchicine 0.6 MG tablet  Take 0.6 mg by mouth daily.     ipratropium 0.06 % nasal spray  Commonly known as:  ATROVENT  Place 2 sprays into the nose 3 (three) times daily.           Follow-up Information   Follow  up with Garnett FarmTTELIN,Azaylea Maves C, MD On 02/03/2014. (at 10:15)    Specialty:  Urology   Contact information:   [address]  [place] [id number] [phone]       Signed: Garnett FarmMark C Lunabelle Oatley 01/28/2014, 8:24 AM

## 2014-01-28 NOTE — Progress Notes (Signed)
Foley catheter removed per order.  Pt tolerated procedure well.  Urine clear yellow.  Pt now due to void.

## 2014-03-26 ENCOUNTER — Encounter: Payer: Self-pay | Admitting: Podiatry

## 2014-03-26 ENCOUNTER — Ambulatory Visit (INDEPENDENT_AMBULATORY_CARE_PROVIDER_SITE_OTHER): Payer: No Typology Code available for payment source | Admitting: Podiatry

## 2014-03-26 ENCOUNTER — Ambulatory Visit (INDEPENDENT_AMBULATORY_CARE_PROVIDER_SITE_OTHER): Payer: No Typology Code available for payment source

## 2014-03-26 VITALS — BP 125/82 | HR 64 | Resp 16 | Ht 74.0 in | Wt 230.0 lb

## 2014-03-26 DIAGNOSIS — M722 Plantar fascial fibromatosis: Secondary | ICD-10-CM

## 2014-03-26 MED ORDER — TRIAMCINOLONE ACETONIDE 10 MG/ML IJ SUSP
10.0000 mg | Freq: Once | INTRAMUSCULAR | Status: AC
  Administered 2014-03-26: 10 mg

## 2014-03-26 NOTE — Patient Instructions (Signed)

## 2014-03-26 NOTE — Progress Notes (Signed)
   Subjective:    Patient ID: Adam BeckMatthew J Fox, male    DOB: 11/14/1960, 53 y.o.   MRN: [id number]  HPI Comments: "I have painful feet"  Patient c/o burning, sharp plantar and posterior heels bilateral, right over left, for several months. He does have AM pain and after sitting for long periods. The plantar forefoot feels numb a lot. Walking aggravates. Tried rolling tennis ball and stretching. Not helping.  Foot Pain      Review of Systems  Musculoskeletal:       Groin pain  All other systems reviewed and are negative.      Objective:   Physical Exam        Assessment & Plan:

## 2014-03-27 NOTE — Progress Notes (Signed)
Subjective:     Patient ID: Adam Fox, male   DOB: 06/06/1961, 53 y.o.   MRN: [id number]  Foot Pain   patient presents stating that he is having a lot of problems with his feet and that the bottom of the heels have been hurting him for a long time the back of the heels hurt him in the forefoot hurts him also he has tried rolling tennis balls over this and states that the pain has just gradually gotten worse over the years   Review of Systems  All other systems reviewed and are negative.      Objective:   Physical Exam  Nursing note and vitals reviewed. Constitutional: He is oriented to person, place, and time.  Cardiovascular: Intact distal pulses.   Musculoskeletal: Normal range of motion.  Neurological: He is oriented to person, place, and time.  Skin: Skin is warm.   vascular status was found to be intact with mild diminishment of neurological which could indicate some low-grade neuropathy which I want to evaluate as time goes on. Patient is found to have discomfort in the plantar heels bilateral and mild discomfort in the back of the heels bilateral which occurs with heel. 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 Today were to start I try to reduce the plantar inflammation see how he responds and decide what else may be necessary for him I injected the plantar fascia bilateral 3 mg Kenalog 5 mg I can Marcaine mixture and I went ahead today and I scanned for custom orthotics. We will see what the results are and decide what else we may need to do to try to help this patient and also placed on diclofenac 75 mg  twice a day

## 2014-04-03 ENCOUNTER — Ambulatory Visit (INDEPENDENT_AMBULATORY_CARE_PROVIDER_SITE_OTHER): Payer: No Typology Code available for payment source | Admitting: Podiatry

## 2014-04-03 ENCOUNTER — Encounter: Payer: Self-pay | Admitting: Podiatry

## 2014-04-03 VITALS — BP 144/88 | HR 77 | Resp 16

## 2014-04-03 DIAGNOSIS — M722 Plantar fascial fibromatosis: Secondary | ICD-10-CM

## 2014-04-03 MED ORDER — DICLOFENAC SODIUM 75 MG PO TBEC: 75.0000 mg | DELAYED_RELEASE_TABLET | Freq: Two times a day (BID) | ORAL | Status: AC

## 2014-04-05 NOTE — Progress Notes (Signed)
Subjective:     Patient ID: Adam Fox, male   DOB: 03/15/1961, 53 y.o.   MRN: [id number]  HPI patient presents stating I had relief for several days and now recurrence but I think is a little bit better than it was but it is worse when I get up in the morning or after periods of sitting   Review of Systems     Objective:   Physical Exam Neurovascular status intact with muscle strength unchanged and noted to have discomfort of a moderate nature in the plantar fascia of both feet    Assessment:     Plantar fasciitis of both feet which continues to persist    Plan:     Reviewed the difficulty of this condition and the years that he's had it and at this time I did dispensed night splint to try to increase stretch continued diclofenac and dispensed rigid graphite insoles to try to reduce the bending forces on ladders. He will be seen back when orthotics are ready

## 2014-04-21 ENCOUNTER — Other Ambulatory Visit: Payer: No Typology Code available for payment source

## 2014-04-21 ENCOUNTER — Telehealth: Payer: Self-pay | Admitting: *Deleted

## 2014-04-21 NOTE — Telephone Encounter (Signed)
Calling in reference to an appointment I was supposed to have today at 8:30am.  I was told my orthotics weren't going to be ready by then.  If someone could call and let me know what's going on.

## 2014-04-23 ENCOUNTER — Other Ambulatory Visit: Payer: No Typology Code available for payment source

## 2014-09-11 ENCOUNTER — Ambulatory Visit: Payer: No Typology Code available for payment source | Admitting: Podiatry

## 2014-09-18 ENCOUNTER — Ambulatory Visit (INDEPENDENT_AMBULATORY_CARE_PROVIDER_SITE_OTHER): Payer: No Typology Code available for payment source

## 2014-09-18 ENCOUNTER — Ambulatory Visit (INDEPENDENT_AMBULATORY_CARE_PROVIDER_SITE_OTHER): Payer: No Typology Code available for payment source | Admitting: Podiatry

## 2014-09-18 ENCOUNTER — Encounter: Payer: Self-pay | Admitting: Podiatry

## 2014-09-18 VITALS — BP 167/101 | HR 75 | Resp 14 | Ht 75.0 in | Wt 230.0 lb

## 2014-09-18 DIAGNOSIS — M2042 Other hammer toe(s) (acquired), left foot: Secondary | ICD-10-CM

## 2014-09-18 DIAGNOSIS — M720 Palmar fascial fibromatosis [Dupuytren]: Secondary | ICD-10-CM

## 2014-09-18 DIAGNOSIS — L600 Ingrowing nail: Secondary | ICD-10-CM

## 2014-09-18 NOTE — Patient Instructions (Signed)

## 2014-09-18 NOTE — Progress Notes (Signed)
   Subjective:    Patient ID: Adam Fox, male    DOB: 03/08/1961, 53 y.o.   MRN: [id number]  HPI Comments: Pt states he has continued to have B/L heel pain over 2 years.  Pt request 2nd pr of orthotics for workboot and extra padding in the heels because they are painful.  Pt complains of rubbing pain between left 1, and 2nd toes for 3 months, has used toe separators, but has lost the latest one.  Toe Pain       Review of Systems  Cardiovascular:       Headaches  Neurological: Positive for dizziness and headaches.       Recently diagnosis with ADD  All other systems reviewed and are negative.      Objective:   Physical Exam        Assessment & Plan:

## 2014-09-21 NOTE — Progress Notes (Signed)
Subjective:     Patient ID: Adam Fox, male   DOB: 06/14/1961, 53 y.o.   MRN: [id number]  HPI patient presents stating that his heels are improved but still hurting and she's having trouble with his big toenail on his left foot facing his second toe with structural deformity but it appears to be more within the nailbed itself with inflammation and pain when he presses the lateral side and states he cannot trim the corner out himself   Review of Systems     Objective:   Physical Exam Neurovascular status intact with muscle strength adequate and range of motion within normal limits. Patient's pain in the heels is moderate but present but the left hallux nail lateral border is incurvated and sore with redness along the edge and pain. I did not note any current drainage at this time    Assessment:     Ingrown toenail deformity left hallux lateral border with an abutment of the hallux and second toe as part of the pathology along with chronic plantar fasciitis    Plan:     Educated him on the nail disease that he has and I recommended removal of the corner and explained the procedure and the fact this will not change the position of his big toe but I'm very hopeful it will alleviate his pain in a simple way. I explained procedure and risk wants procedure done and today I infiltrated 60 mg Xylocaine Marcaine mixture removed the lateral border exposed matrix and apply chemical phenol 3 applications 30 seconds followed by alcohol lavaged and sterile dressing. Gave instructions on soaks and reappoint and also discussed increased padding for his orthotics

## 2014-10-20 ENCOUNTER — Telehealth: Payer: Self-pay | Admitting: *Deleted

## 2014-10-20 NOTE — Telephone Encounter (Signed)
Patient came into office to pick up orthotics he has had before no appointment necessary.  Patient will call if any questions or concerns.

## 2014-10-21 ENCOUNTER — Ambulatory Visit: Payer: Self-pay | Admitting: *Deleted

## 2014-10-21 DIAGNOSIS — M722 Plantar fascial fibromatosis: Secondary | ICD-10-CM

## 2014-10-21 NOTE — Patient Instructions (Signed)
WILL CALL WHEN ORTHOTICS RETURN

## 2014-10-21 NOTE — Progress Notes (Signed)
THESE ORTHOTICS ARE NOT RIGHT IT IS MISSING THE PAD AND IT WAS SUPPOSED TO HAVE A THICKER CUSHION   WE WILL RETURN ORTHOTICS FOR CORRECTION AND CONTACT PATIENT WHEN THEY RETURN

## 2014-10-27 ENCOUNTER — Telehealth: Payer: Self-pay | Admitting: *Deleted

## 2014-10-27 NOTE — Telephone Encounter (Signed)
Patient brought orthotics back pointing out that several accommodations were missing.  Met pad and thicker padding are not present.  We will return to manufacturer for modifications and call patient when they return.

## 2015-12-14 ENCOUNTER — Other Ambulatory Visit: Payer: Self-pay | Admitting: Ophthalmology

## 2016-01-19 ENCOUNTER — Other Ambulatory Visit: Payer: Self-pay | Admitting: Physician Assistant

## 2017-02-15 ENCOUNTER — Telehealth: Payer: Self-pay | Admitting: Vascular Surgery

## 2017-02-15 NOTE — Telephone Encounter (Signed)
-----   Message from Micah FlesherSonya D Rankin, RN sent at 02/15/2017 12:01 PM EDT ----- Regarding: scheduling Contact: [phone] Follow-up on earlier staff message sent this morning. Please call Keene BreathMatthew Stripling at [phone] to set up new patient  [facility] appointment with Dr. Hart RochesterLawson.  He does not need venous reflux as it was done at [facility].  Mr. Jacqulyn DuckingButwinski  is having [facility] Fax over clinicals.

## 2017-02-15 NOTE — Telephone Encounter (Signed)
Sched appt 03/06/17 at 2:00. Spoke to pt to confirm appt.

## 2017-02-15 NOTE — Telephone Encounter (Signed)
scheduling  Received: Today  Message Contents  Rankin, Sonya D, RN  P [facility] Admin Pool        Adam Fox (sp?) called today on behalf of her husband, [name]. [name] has been seen at [facility] but is out-of -network with them. I have instructed Adam Fox to have [facility] FAX over all her husband's medical records to [facility]. [name] will be calling to schedule an appointment. Please schedule him for a new [facility] consult with Dr. Hart RochesterLawson. It is unclear if he has had a venous reflux (bilateral) study at [facility] so schedule venous reflux study and we can always cancel it. Please do NOT schedule at end of day. If I see the FAX come through from [facility], I will let you know. Thanks!

## 2017-02-22 ENCOUNTER — Encounter: Payer: Self-pay | Admitting: Vascular Surgery

## 2017-03-06 ENCOUNTER — Encounter: Payer: Self-pay | Admitting: Vascular Surgery

## 2017-03-06 ENCOUNTER — Ambulatory Visit (INDEPENDENT_AMBULATORY_CARE_PROVIDER_SITE_OTHER): Payer: BC Managed Care – PPO | Admitting: Vascular Surgery

## 2017-03-06 VITALS — BP 152/96 | HR 91 | Temp 98.0°F | Resp 18 | Ht 74.0 in | Wt 231.0 lb

## 2017-03-06 DIAGNOSIS — I83893 Varicose veins of bilateral lower extremities with other complications: Secondary | ICD-10-CM | POA: Diagnosis not present

## 2017-03-06 DIAGNOSIS — I83891 Varicose veins of right lower extremities with other complications: Secondary | ICD-10-CM | POA: Insufficient documentation

## 2017-03-06 NOTE — Progress Notes (Signed)
Vitals:   03/06/17 1352  BP: (!) 148/87  Pulse: 91  Resp: 18  Temp: 98 F (36.7 C)  SpO2: 97%  Weight: 231 lb (104.8 kg)  Height: 6\' 2"  (1.88 m)

## 2017-03-06 NOTE — Progress Notes (Signed)
Subjective:     Patient ID: Adam Fox, male   DOB: [birth year], 56 y.o.   MRN: [id number]  HPI This 56 year old male is evaluated for painful varicosities right leg worse than left. He has aching throbbing and burning discomfort in the right leg which worsens as the day progresses and involves the lateral thigh and calf down to the ankle area. He does develop some swelling. He has been evaluated at [facility] by Dr. Mirian Mo and was found to have gross reflux in the right great saphenous system-anterior accessory branch supplying these painful varicosities and it was recommended that he have laser ablation with ambulatory phlebectomy. He did try long-leg elastic compression stockings 20-30 millimeter gradient, elevation, and ibuprofen and this was approved by [facility] the patient was found to be out of network and he now comes to the [facility] center. His symptoms are affecting his daily living and resistant to conservative measures with his right leg being the most symptomatic. He has no history of DVT thrombophlebitis stasis ulcers or bleeding.  Past Medical History:  Diagnosis Date  . ADD (attention deficit disorder)   . Alcoholism (HCC)   . ED (erectile dysfunction) of organic origin   . History of gout   . History of lymphadenopathy    2008--  S/P BILATERAL PELVIC LYMPH NODE DISSECTION  . History of prostate cancer    2008--  S/P  RADICAL PROSTATECTOMY  . HTN (hypertension)   . Nocturia   . Plantar fasciitis, bilateral     Social History  Substance Use Topics  . Smoking status: Former Smoker    Quit date: 01/06/2003  . Smokeless tobacco: Never Used  . Alcohol use 58.8 oz/week    98 Cans of beer per week     Comment: ALCOHOL ABUSE--  14 beers daily    No family history on file.  Allergies  Allergen Reactions  . Penicillins Other (See Comments)    Unknown Childhood allergy      Current Outpatient Prescriptions:  .  amphetamine-dextroamphetamine  (ADDERALL XR) 20 MG 24 hr capsule, Take 20 mg by mouth daily., Disp: , Rfl:  .  buPROPion (WELLBUTRIN SR) 150 MG 12 hr tablet, Take 150 mg by mouth daily., Disp: , Rfl:  .  citalopram (CELEXA) 20 MG tablet, Take 20 mg by mouth daily., Disp: , Rfl:  .  diclofenac (VOLTAREN) 75 MG EC tablet, Take 1 tablet (75 mg total) by mouth 2 (two) times daily., Disp: 50 tablet, Rfl: 2 .  FENOFIBRATE PO, Take by mouth., Disp: , Rfl:  .  ibuprofen (ADVIL,MOTRIN) 200 MG tablet, Take 200 mg by mouth every 6 (six) hours as needed., Disp: , Rfl:  .  lisinopril-hydrochlorothiazide (PRINZIDE,ZESTORETIC) 20-12.5 MG per tablet, Take 1 tablet by mouth daily., Disp: , Rfl:   Vitals:   03/06/17 1352 03/06/17 1405  BP: (!) 148/87 (!) 152/96  Pulse: 91 91  Resp: 18   Temp: 98 F (36.7 C)   SpO2: 97%   Weight: 231 lb (104.8 kg)   Height: 6\' 2"  (1.88 m)     Body mass index is 29.66 kg/m.         Review of Systems Denies chest pain, dyspnea on exertion, PND, orthopnea, hemoptysis, claudication    Objective:   Physical Exam BP (!) 152/96 (BP Location: Left Arm, Patient Position: Sitting, Cuff Size: Large)   Pulse 91   Temp 98 F (36.7 C)   Resp 18   Ht 6'  2" (1.88 m)   Wt 231 lb (104.8 kg)   SpO2 97%   BMI 29.66 kg/m     Gen.-alert and oriented x3 in no apparent distress HEENT normal for age Lungs no rhonchi or wheezing Cardiovascular regular rhythm no murmurs carotid pulses 3+ palpable no bruits audible Abdomen soft nontender no palpable masses Musculoskeletal free of  major deformities Skin clear -no rashes Neurologic normal Lower extremities 3+ femoral and dorsalis pedis pulses palpable bilaterally with no edema on the left 1+ edema on the right Bulging varicosities beginning in the mid anterior thigh extending laterally in the thigh lateral to the knee and into the lateral pretibial space. No hyperpigmentation or ulceration noted distally. Left leg has smaller varicosities in the medial  calf and thigh.  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 Patient previously evaluated at [facility] by Dr. Jimmie MollyGreenberg and procedure was approved for laser ablation and multiple stab phlebectomy    Plan:     Patient needs laser ablation right great saphenous system-anterior accessory branch plus greater than 20 stab phlebectomy of painful varicosities to relieve his symptoms are affecting his daily living and resistant to conservative measures We'll proceed with precertification

## 2017-03-08 ENCOUNTER — Encounter: Payer: Self-pay | Admitting: Family Medicine

## 2017-03-14 ENCOUNTER — Other Ambulatory Visit: Payer: Self-pay | Admitting: *Deleted

## 2017-03-14 DIAGNOSIS — I83893 Varicose veins of bilateral lower extremities with other complications: Secondary | ICD-10-CM

## 2017-03-15 ENCOUNTER — Encounter: Payer: Self-pay | Admitting: Vascular Surgery

## 2017-03-20 ENCOUNTER — Encounter: Payer: BC Managed Care – PPO | Admitting: Vascular Surgery

## 2017-03-20 ENCOUNTER — Ambulatory Visit (INDEPENDENT_AMBULATORY_CARE_PROVIDER_SITE_OTHER): Payer: BC Managed Care – PPO | Admitting: Vascular Surgery

## 2017-03-20 ENCOUNTER — Encounter: Payer: Self-pay | Admitting: Vascular Surgery

## 2017-03-20 VITALS — BP 142/76 | HR 81 | Temp 97.3°F | Resp 16 | Ht 74.0 in | Wt 231.0 lb

## 2017-03-20 DIAGNOSIS — I83891 Varicose veins of right lower extremities with other complications: Secondary | ICD-10-CM | POA: Diagnosis not present

## 2017-03-20 DIAGNOSIS — I868 Varicose veins of other specified sites: Secondary | ICD-10-CM

## 2017-03-20 HISTORY — PX: ENDOVENOUS ABLATION SAPHENOUS VEIN W/ LASER: SUR449

## 2017-03-20 NOTE — Progress Notes (Signed)
Laser Ablation Procedure    Date: 03/20/2017   [facility]02/18/1961  Consent signed: Yes    Surgeon:  Dr. Quita SkyeJames D. Hart RochesterLawson  Procedure: Laser Ablation: right Greater Saphenous Vein  BP (!) 142/76 (BP Location: Left Arm, Patient Position: Sitting, Cuff Size: Normal)   Pulse 81   Temp 97.3 F (36.3 C) (Oral)   Resp 16   Ht 6\' 2"  (1.88 m)   Wt 231 lb (104.8 kg)   SpO2 97%   BMI 29.66 kg/m   Tumescent Anesthesia: 300 cc 0.9% NaCl with 50 cc Lidocaine HCL with 1% Epi and 15 cc 8.4% NaHCO3  Local Anesthesia: 3 cc Lidocaine HCL and NaHCO3 (ratio 2:1)  Pulsed Mode: 15 watts, 500ms delay, 1.0 duration  Total Energy: 778 Joules             Total Pulses: 52               Total Time: 0:52    Stab Phlebectomy: >20 Sites: Thigh and Calf  Patient tolerated procedure well    Description of Procedure:  After marking the course of the secondary varicosities, the patient was placed on the operating table in the supine position, and the right leg was prepped and draped in sterile fashion.   Local anesthetic was administered and under ultrasound guidance the saphenous vein was accessed with a micro needle and guide wire; then the mirco puncture sheath was placed.  A guide wire was inserted saphenofemoral junction , followed by a 5 french sheath.  The position of the sheath and then the laser fiber below the junction was confirmed using the ultrasound.  Tumescent anesthesia was administered along the course of the saphenous vein using ultrasound guidance. The patient was placed in Trendelenburg position and protective laser glasses were placed on patient and staff, and the laser was fired at 15 watts continuous mode advancing 1-342mm/second for a total of 778 joules.   For stab phlebectomies, local anesthetic was administered at the previously marked varicosities, and tumescent anesthesia was administered around the vessels.  Greater than 20 stab wounds were made using the tip of an 11  blade. And using the vein hook, the phlebectomies were performed using a hemostat to avulse the varicosities.  Adequate hemostasis was achieved.     Steri strips were applied to the stab wounds and ABD pads and thigh high compression stockings were applied.  Ace wrap bandages were applied over the phlebectomy sites and at the top of the saphenofemoral junction. Blood loss was less than 15 cc.  The patient ambulated out of the operating room having tolerated the procedure well.

## 2017-03-20 NOTE — Progress Notes (Signed)
Subjective:     Patient ID: Adam Fox, male   DOB: 10/08/1960, 56 y.o.   MRN: [id number]  HPI This 56 year old male had laser ablation of the right great saphenous vein-anterior accessory branch plus greater than 20 stab phlebectomy of painful varicosities performed under local tumescent anesthesia. A total of 778 J of energy was utilized. He tolerated the procedures well.  Review of Systems     Objective:   Physical Exam BP (!) 142/76 (BP Location: Left Arm, Patient Position: Sitting, Cuff Size: Normal)   Pulse 81   Temp 97.3 F (36.3 C) (Oral)   Resp 16   Ht 6\' 2"  (1.88 m)   Wt 231 lb (104.8 kg)   SpO2 97%   BMI 29.66 kg/m       Assessment:     Well-tolerated laser ablation anterior accessory branch right great saphenous vein plus greater than 20 stab phlebectomy of painful varicosities performed under local tumescent anesthesia    Plan:     Return in 1 week for venous duplex exam to confirm closure right great saphenous vein

## 2017-03-21 ENCOUNTER — Encounter: Payer: Self-pay | Admitting: Vascular Surgery

## 2017-03-28 ENCOUNTER — Encounter (HOSPITAL_COMMUNITY): Payer: BC Managed Care – PPO

## 2017-03-28 ENCOUNTER — Ambulatory Visit: Payer: BC Managed Care – PPO | Admitting: Vascular Surgery

## 2018-09-30 ENCOUNTER — Other Ambulatory Visit: Payer: Self-pay

## 2018-09-30 ENCOUNTER — Emergency Department (HOSPITAL_COMMUNITY): Payer: BC Managed Care – PPO

## 2018-09-30 ENCOUNTER — Emergency Department (HOSPITAL_COMMUNITY)
Admission: EM | Admit: 2018-09-30 | Discharge: 2018-09-30 | Disposition: A | Discharge status: Home / Self Care | Payer: BC Managed Care – PPO | Attending: Emergency Medicine | Admitting: Emergency Medicine

## 2018-09-30 DIAGNOSIS — Y939 Activity, unspecified: Secondary | ICD-10-CM | POA: Diagnosis not present

## 2018-09-30 DIAGNOSIS — Y998 Other external cause status: Secondary | ICD-10-CM | POA: Diagnosis not present

## 2018-09-30 DIAGNOSIS — S0083XA Contusion of other part of head, initial encounter: Secondary | ICD-10-CM

## 2018-09-30 DIAGNOSIS — S022XXA Fracture of nasal bones, initial encounter for closed fracture: Secondary | ICD-10-CM | POA: Diagnosis not present

## 2018-09-30 DIAGNOSIS — S0181XA Laceration without foreign body of other part of head, initial encounter: Secondary | ICD-10-CM | POA: Insufficient documentation

## 2018-09-30 DIAGNOSIS — I10 Essential (primary) hypertension: Secondary | ICD-10-CM | POA: Diagnosis not present

## 2018-09-30 DIAGNOSIS — Z79899 Other long term (current) drug therapy: Secondary | ICD-10-CM | POA: Insufficient documentation

## 2018-09-30 DIAGNOSIS — Z87891 Personal history of nicotine dependence: Secondary | ICD-10-CM | POA: Insufficient documentation

## 2018-09-30 DIAGNOSIS — S0990XA Unspecified injury of head, initial encounter: Secondary | ICD-10-CM | POA: Diagnosis present

## 2018-09-30 DIAGNOSIS — Z8546 Personal history of malignant neoplasm of prostate: Secondary | ICD-10-CM | POA: Diagnosis not present

## 2018-09-30 DIAGNOSIS — Y92009 Unspecified place in unspecified non-institutional (private) residence as the place of occurrence of the external cause: Secondary | ICD-10-CM | POA: Insufficient documentation

## 2018-09-30 DIAGNOSIS — Z23 Encounter for immunization: Secondary | ICD-10-CM | POA: Insufficient documentation

## 2018-09-30 MED ORDER — LIDOCAINE-EPINEPHRINE (PF) 2 %-1:200000 IJ SOLN
10.0000 mL | Freq: Once | INTRAMUSCULAR | Status: AC
  Administered 2018-09-30: 10 mL via INTRADERMAL
  Filled 2018-09-30: qty 20

## 2018-09-30 MED ORDER — BACITRACIN ZINC 500 UNIT/GM EX OINT
TOPICAL_OINTMENT | Freq: Once | CUTANEOUS | Status: AC
  Administered 2018-09-30: 07:00:00 via TOPICAL

## 2018-09-30 MED ORDER — TETANUS-DIPHTH-ACELL PERTUSSIS 5-2.5-18.5 LF-MCG/0.5 IM SUSP
0.5000 mL | Freq: Once | INTRAMUSCULAR | Status: AC
  Administered 2018-09-30: 0.5 mL via INTRAMUSCULAR
  Filled 2018-09-30: qty 0.5

## 2018-09-30 MED ORDER — OXYCODONE-ACETAMINOPHEN 5-325 MG PO TABS
1.0000 | ORAL_TABLET | ORAL | Status: DC | PRN
  Administered 2018-09-30: 1 via ORAL
  Filled 2018-09-30: qty 1

## 2018-09-30 MED ORDER — CEPHALEXIN 500 MG PO CAPS: 500.0000 mg | ORAL_CAPSULE | Freq: Three times a day (TID) | ORAL | 0 refills | Status: AC
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
## 2018-09-30 NOTE — Discharge Instructions (Addendum)
For your laceration:  It is okay to shower, but no swimming or submerging your head underwater  Keep the wound out of the sun  Cover the wound 2-3 times daily with antibiotic ointment  For nasal congestion:  It is OKAY to use AFRIN for up to 3 days, but do not use for longer Saline nasal spray can help loosen blood NO NOSE BLOWING for at least the next week

## 2018-09-30 NOTE — ED Notes (Signed)
Patient ambulatory alert steady gait.
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
## 2019-08-21 ENCOUNTER — Other Ambulatory Visit: Payer: Self-pay

## 2019-08-21 DIAGNOSIS — Z20822 Contact with and (suspected) exposure to covid-19: Secondary | ICD-10-CM

## 2019-08-23 LAB — NOVEL CORONAVIRUS, NAA: SARS-CoV-2, NAA: NOT DETECTED
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
## 2023-08-03 ENCOUNTER — Other Ambulatory Visit: Payer: Self-pay | Admitting: General Surgery

## 2023-08-03 DIAGNOSIS — R591 Generalized enlarged lymph nodes: Secondary | ICD-10-CM

## 2023-08-07 ENCOUNTER — Other Ambulatory Visit: Payer: Self-pay | Admitting: General Surgery

## 2023-08-07 ENCOUNTER — Ambulatory Visit
Admission: RE | Admit: 2023-08-07 | Discharge: 2023-08-07 | Disposition: A | Discharge status: Home / Self Care | Payer: BC Managed Care – PPO | Source: Ambulatory Visit | Attending: General Surgery | Admitting: General Surgery

## 2023-08-07 DIAGNOSIS — R591 Generalized enlarged lymph nodes: Secondary | ICD-10-CM

## 2023-08-07 DIAGNOSIS — R590 Localized enlarged lymph nodes: Secondary | ICD-10-CM

## 2023-08-07 MED ORDER — IOPAMIDOL (ISOVUE-300) INJECTION 61%
100.0000 mL | Freq: Once | INTRAVENOUS | Status: AC | PRN
  Administered 2023-08-07: 100 mL via INTRAVENOUS

## 2023-08-07 NOTE — Progress Notes (Signed)
I called the patient to discuss the results of his CT but there was no answer.  It appears he has extensive lymphadenopathy as well as splenomegaly, concerning for a neoplastic process. I have placed referrals to IR in [facility] and [facility]'s systems to help expedite a workup and will plan to refer him to oncology and/or urology pending the results.  I will attempt to call him again.

## 2023-08-18 ENCOUNTER — Other Ambulatory Visit: Payer: Self-pay | Admitting: General Surgery

## 2023-08-18 DIAGNOSIS — R591 Generalized enlarged lymph nodes: Secondary | ICD-10-CM

## 2023-09-08 ENCOUNTER — Other Ambulatory Visit (HOSPITAL_COMMUNITY): Payer: Self-pay | Admitting: Internal Medicine

## 2023-09-08 DIAGNOSIS — C8200 Follicular lymphoma grade I, unspecified site: Secondary | ICD-10-CM

## 2023-09-15 ENCOUNTER — Other Ambulatory Visit (HOSPITAL_COMMUNITY): Payer: Self-pay

## 2023-09-15 ENCOUNTER — Encounter (HOSPITAL_COMMUNITY): Payer: Self-pay

## 2023-09-15 ENCOUNTER — Encounter (HOSPITAL_COMMUNITY)
Admission: RE | Admit: 2023-09-15 | Discharge: 2023-09-15 | Disposition: A | Discharge status: Home / Self Care | Payer: BC Managed Care – PPO | Source: Ambulatory Visit | Attending: Internal Medicine | Admitting: Internal Medicine

## 2023-09-15 ENCOUNTER — Ambulatory Visit (HOSPITAL_COMMUNITY)
Admission: RE | Admit: 2023-09-15 | Discharge: 2023-09-15 | Disposition: A | Discharge status: Home / Self Care | Payer: BC Managed Care – PPO | Source: Ambulatory Visit | Attending: Internal Medicine | Admitting: Internal Medicine

## 2023-09-15 DIAGNOSIS — C8200 Follicular lymphoma grade I, unspecified site: Secondary | ICD-10-CM | POA: Insufficient documentation

## 2023-09-15 LAB — GLUCOSE, CAPILLARY: Glucose-Capillary: 85 mg/dL (ref 70–99)

## 2023-09-15 MED ORDER — IOHEXOL 300 MG/ML  SOLN
100.0000 mL | Freq: Once | INTRAMUSCULAR | Status: AC | PRN
  Administered 2023-09-15: 100 mL via INTRAVENOUS

## 2023-09-15 MED ORDER — FLUDEOXYGLUCOSE F - 18 (FDG) INJECTION
9.8400 | Freq: Once | INTRAVENOUS | Status: AC
  Administered 2023-09-15: 9.84 via INTRAVENOUS

## 2023-09-15 MED ORDER — IOHEXOL 300 MG/ML  SOLN
30.0000 mL | Freq: Once | INTRAMUSCULAR | Status: AC | PRN
  Administered 2023-09-15: 30 mL via ORAL

## 2023-09-21 ENCOUNTER — Telehealth: Payer: Self-pay | Admitting: *Deleted
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
## 2023-09-25 NOTE — Progress Notes (Signed)
Spoke to Dr Leonides Schanz concerning this referral and he advised to schedule the patient with Karena Addison on 10/24/23 at 0900. Message sent to Neomia Glass, RN and Dow Adolph to schedule patient. Will continue to monitor.

## 2023-10-09 NOTE — Progress Notes (Signed)
 Received referral 09/25/23 from Allyson Roney at [facility] center, through [facility] at [facility], for patient to transfer here for treatments due to the fact that they live in [place]. Our scheduler Lesley has spoken with the patient and wife multiple times to try to set up an appointment. Lesley contacted me on 10/06/23 to advise me that she was told by the wife that they had not decided what they are going to do yet. She contacted me again today after speaking to the wife, they are still unsure what they want to do, Lesley explained that the was fine and they can take their time deciding and to just call us  when they decide and we will be glad to get an appointment as soon as we are able.

## 2023-11-22 ENCOUNTER — Other Ambulatory Visit (HOSPITAL_COMMUNITY): Payer: Self-pay | Admitting: Nurse Practitioner

## 2023-11-22 DIAGNOSIS — C8218 Follicular lymphoma grade II, lymph nodes of multiple sites: Secondary | ICD-10-CM

## 2023-11-28 ENCOUNTER — Ambulatory Visit (HOSPITAL_COMMUNITY)
Admission: RE | Admit: 2023-11-28 | Discharge: 2023-11-28 | Disposition: A | Discharge status: Home / Self Care | Payer: 59 | Source: Ambulatory Visit | Attending: Nurse Practitioner | Admitting: Nurse Practitioner

## 2023-11-28 DIAGNOSIS — C8218 Follicular lymphoma grade II, lymph nodes of multiple sites: Secondary | ICD-10-CM | POA: Insufficient documentation

## 2023-11-28 MED ORDER — IOHEXOL 350 MG/ML SOLN
75.0000 mL | Freq: Once | INTRAVENOUS | Status: AC | PRN
  Administered 2023-11-28: 75 mL via INTRAVENOUS

## 2024-03-26 ENCOUNTER — Other Ambulatory Visit: Payer: Self-pay | Admitting: Nurse Practitioner

## 2024-03-26 DIAGNOSIS — C8218 Follicular lymphoma grade II, lymph nodes of multiple sites: Secondary | ICD-10-CM

## 2024-03-27 ENCOUNTER — Other Ambulatory Visit (HOSPITAL_COMMUNITY): Payer: Self-pay | Admitting: Nurse Practitioner

## 2024-03-27 DIAGNOSIS — C8218 Follicular lymphoma grade II, lymph nodes of multiple sites: Secondary | ICD-10-CM

## 2024-04-09 ENCOUNTER — Encounter (HOSPITAL_COMMUNITY)
Admission: RE | Admit: 2024-04-09 | Discharge: 2024-04-09 | Disposition: A | Discharge status: Home / Self Care | Source: Ambulatory Visit | Attending: Nurse Practitioner | Admitting: Nurse Practitioner

## 2024-04-09 DIAGNOSIS — C8218 Follicular lymphoma grade II, lymph nodes of multiple sites: Secondary | ICD-10-CM | POA: Insufficient documentation

## 2024-04-09 LAB — GLUCOSE, CAPILLARY: Glucose-Capillary: 91 mg/dL (ref 70–99)

## 2024-04-09 MED ORDER — FLUDEOXYGLUCOSE F - 18 (FDG) INJECTION
11.5000 | Freq: Once | INTRAVENOUS | Status: AC
Start: 2024-04-09 — End: 2024-04-09
  Administered 2024-04-09: 9.59 via INTRAVENOUS

## 2024-04-26 ENCOUNTER — Other Ambulatory Visit: Payer: Self-pay

## 2024-04-26 ENCOUNTER — Emergency Department (HOSPITAL_BASED_OUTPATIENT_CLINIC_OR_DEPARTMENT_OTHER)
Admission: EM | Admit: 2024-04-26 | Discharge: 2024-04-26 | Disposition: A | Discharge status: Home / Self Care | Attending: Emergency Medicine | Admitting: Emergency Medicine

## 2024-04-26 DIAGNOSIS — W208XXA Other cause of strike by thrown, projected or falling object, initial encounter: Secondary | ICD-10-CM | POA: Insufficient documentation

## 2024-04-26 DIAGNOSIS — S81812A Laceration without foreign body, left lower leg, initial encounter: Secondary | ICD-10-CM | POA: Insufficient documentation

## 2024-04-26 DIAGNOSIS — Z23 Encounter for immunization: Secondary | ICD-10-CM | POA: Diagnosis not present

## 2024-04-26 DIAGNOSIS — I10 Essential (primary) hypertension: Secondary | ICD-10-CM | POA: Insufficient documentation

## 2024-04-26 DIAGNOSIS — Y9389 Activity, other specified: Secondary | ICD-10-CM | POA: Insufficient documentation

## 2024-04-26 DIAGNOSIS — S81819A Laceration without foreign body, unspecified lower leg, initial encounter: Secondary | ICD-10-CM

## 2024-04-26 DIAGNOSIS — Z79899 Other long term (current) drug therapy: Secondary | ICD-10-CM | POA: Insufficient documentation

## 2024-04-26 DIAGNOSIS — S8992XA Unspecified injury of left lower leg, initial encounter: Secondary | ICD-10-CM | POA: Diagnosis present

## 2024-04-26 MED ORDER — LIDOCAINE-EPINEPHRINE-TETRACAINE (LET) TOPICAL GEL
3.0000 mL | Freq: Once | TOPICAL | Status: AC
  Administered 2024-04-26: 3 mL via TOPICAL
  Filled 2024-04-26: qty 3

## 2024-04-26 MED ORDER — TETANUS-DIPHTH-ACELL PERTUSSIS 5-2.5-18.5 LF-MCG/0.5 IM SUSY
0.5000 mL | PREFILLED_SYRINGE | Freq: Once | INTRAMUSCULAR | Status: AC
  Administered 2024-04-26: 0.5 mL via INTRAMUSCULAR
  Filled 2024-04-26: qty 0.5

## 2024-04-26 MED ORDER — CEPHALEXIN 500 MG PO CAPS: 500.0000 mg | ORAL_CAPSULE | Freq: Four times a day (QID) | ORAL | 0 refills | Status: AC

## 2024-04-26 NOTE — ED Notes (Signed)
 TDAP vaccine fact sheet given to pt. Pt verbalized understanding.
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
## 2024-04-26 NOTE — ED Provider Notes (Signed)
 [place] EMERGENCY DEPARTMENT AT [facility] Provider Note   CSN: [id number] Arrival date & time: 04/26/24  1743     Patient presents with: Laceration   Adam Fox is a 63 y.o. male.    Laceration Patient is a 63 year old male presented the ED today for concerns for a laceration that happened while he was repairing an engine, circular engine piece fell off the engine and landed on his left shin.  Has been able to walk on this with denying any new numbness, weakness, tingling to the area.  No pain to the ankle or knee and no other injuries otherwise.  Unsure when his last tetanus shot was.  Bleeding controlled.       Prior to Admission medications   Medication Sig Start Date End Date Taking? Authorizing Provider  cephALEXin  (KEFLEX ) 500 MG capsule Take 1 capsule (500 mg total) by mouth 4 (four) times daily. 04/26/24  Yes Beola Terrall RAMAN, PA-C  amphetamine-dextroamphetamine (ADDERALL XR) 20 MG 24 hr capsule Take 20 mg by mouth daily.    [provider]  buPROPion (WELLBUTRIN SR) 150 MG 12 hr tablet Take 150 mg by mouth daily.    [provider]  citalopram (CELEXA) 20 MG tablet Take 20 mg by mouth daily.    [provider]  diclofenac  (VOLTAREN ) 75 MG EC tablet Take 1 tablet (75 mg total) by mouth 2 (two) times daily. Patient not taking: Reported on 09/30/2018 04/03/14   Magdalen Pasco RAMAN, DPM  lisinopril-hydrochlorothiazide (PRINZIDE,ZESTORETIC) 20-12.5 MG per tablet Take 1 tablet by mouth daily.    [provider]    Allergies: Penicillins    Review of Systems  Skin:  Positive for wound.  All other systems reviewed and are negative.   Updated Vital Signs BP (!) 154/100   Pulse 100   Temp 98 F (36.7 C) (Temporal)   Resp 19   Ht 6' 2 (1.88 m)   Wt 86.2 kg   SpO2 100%   BMI 24.39 kg/m   Physical Exam Vitals and nursing note reviewed.  Constitutional:      General: He is not in acute distress.    Appearance:  Normal appearance. He is not ill-appearing or diaphoretic.  HENT:     Head: Normocephalic and atraumatic.  Eyes:     Extraocular Movements: Extraocular movements intact.     Conjunctiva/sclera: Conjunctivae normal.  Cardiovascular:     Rate and Rhythm: Normal rate and regular rhythm.     Pulses: Normal pulses.     Heart sounds: Normal heart sounds. No murmur heard.    No friction rub. No gallop.  Pulmonary:     Effort: Pulmonary effort is normal. No respiratory distress.     Breath sounds: Normal breath sounds.  Abdominal:     General: Abdomen is flat.     Palpations: Abdomen is soft.     Tenderness: There is no abdominal tenderness.  Musculoskeletal:        General: Signs of injury present. No swelling, tenderness or deformity.     Right lower leg: No edema.     Left lower leg: No edema.     Comments: Laceration noted to the anterior aspect of left shin measuring approximately 7 cm.  Skin:    General: Skin is warm and dry.     Findings: No bruising or lesion.  Neurological:     General: No focal deficit present.     Mental Status: He is alert and oriented to  person, place, and time. Mental status is at baseline.     Sensory: No sensory deficit.     Motor: No weakness.     Gait: Gait normal.  Psychiatric:        Mood and Affect: Mood normal.     (all labs ordered are listed, but only abnormal results are displayed) Labs Reviewed - No data to display  EKG: None  Radiology: No results found.   .Laceration Repair  Date/Time: 04/26/2024 8:26 PM  Performed by: Beola Terrall RAMAN, PA-C Authorized by: Beola Terrall RAMAN, PA-C   Consent:    Consent obtained:  Verbal   Consent given by:  Patient   Risks, benefits, and alternatives were discussed: yes     Risks discussed:  Infection, nerve damage, need for additional repair, pain, poor cosmetic result, tendon damage, vascular damage, poor wound healing and retained foreign body   Alternatives discussed:  No treatment and  delayed treatment Universal protocol:    Procedure explained and questions answered to patient or proxy's satisfaction: yes     Relevant documents present and verified: yes     Patient identity confirmed:  Verbally with patient and provided demographic data Anesthesia:    Anesthesia method:  Topical application   Topical anesthetic:  LET Laceration details:    Location:  Leg   Leg location:  L lower leg   Length (cm):  8 Exploration:    Hemostasis achieved with:  Direct pressure   Imaging outcome: foreign body noted     Wound exploration: wound explored through full range of motion and entire depth of wound visualized     Contaminated: no   Treatment:    Area cleansed with:  Povidone-iodine, saline and chlorhexidine   Amount of cleaning:  Standard   Irrigation solution:  Sterile saline   Irrigation volume:  500   Irrigation method:  Syringe   Visualized foreign bodies/material removed: no     Debridement:  None   Undermining:  None Skin repair:    Repair method:  Sutures   Suture size:  5-0   Suture material:  Prolene   Suture technique:  Simple interrupted   Number of sutures:  15 Approximation:    Approximation:  Close Repair type:    Repair type:  Intermediate Post-procedure details:    Dressing:  Non-adherent dressing   Procedure completion:  Tolerated well, no immediate complications    Medications Ordered in the ED  lidocaine -EPINEPHrine -tetracaine (LET) topical gel (3 mLs Topical Given 04/26/24 1927)  Tdap (BOOSTRIX ) injection 0.5 mL (0.5 mLs Intramuscular Given 04/26/24 1927)    Medical Decision Making Risk Prescription drug management.   This patient is a 63 year old male who presents to the ED for concern of laceration to left shin after having the engine part fall onto his left shin.  Bleeding controlled at this time, good pulses and sensation and is able to walk without difficulty.  No pain over the tibia or fibula.  Wound up with fully explored and does  extend to muscle fascia.  Area was cleaned extensively and wound repaired, will place him on antibiotics due to the extensive nature of the wound.  Bandage with nonadhesive dressing.  And return in 7 to 10 days for suture removal to PCP or ER.  Patient vital signs have remained stable throughout the course of patient's time in the ED. Low suspicion for any other emergent pathology at this time. I believe this patient is safe to be discharged. Provided strict return  to ER precautions. Patient expressed agreement and understanding of plan. All questions were answered.  Differential diagnoses prior to evaluation: The emergent differential diagnosis includes, but is not limited to, fracture, ligamentous injury, neurovascular injury, dislocation, malalignment, laceration. This is not an exhaustive differential.   Past Medical History / Co-morbidities / Social History: Alcoholism, HTN  Additional history: Chart reviewed. 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  Lab Tests/Imaging studies: No labs or imaging are needed at this time.    Medications: I ordered medication including let, Keflex .  I have reviewed the patients home medicines and have made adjustments as needed.  Critical Interventions: None  Social Determinants of Health: Has good follow-up with PCP.  Disposition: After consideration of the diagnostic results and the patients response to treatment, I feel that the patient would benefit from discharge intern as above.   emergency department workup does not suggest an emergent condition requiring admission or immediate intervention beyond what has been performed at this time. The plan is: Keflex  for prophylaxis, tetanus shot provided, follow with PCP in 710 days for suture removal, monitor symptoms. The patient is safe for discharge and has been instructed to return immediately for worsening  symptoms, change in symptoms or any other concerns.    Final diagnoses:  Laceration of shin    ED Discharge Orders          Ordered    cephALEXin  (KEFLEX ) 500 MG capsule  4 times daily        04/26/24 2029               Imoni Kohen S, PA-C 04/26/24 2032    Long, Joshua G, MD 04/26/24 [phone]

## 2024-04-26 NOTE — ED Notes (Signed)
 ED Provider at bedside for lac repair

## 2024-04-26 NOTE — ED Notes (Signed)
 Bacitracin  and gauze placed on L ankle wound, wrapped in coband. Wound care explained to pt. Pt verbalized understanding. DC instructions given, pt verbalized understanding. Pt out of ED with all paperwork, belongings, wound care supplies with steady gait and not in visible distress with friend.

## 2024-04-26 NOTE — ED Triage Notes (Signed)
 Pt POV ambulatory to triage reporting L lower leg lac after engine fell on leg, bleeding controlled at this time.

## 2024-04-26 NOTE — ED Notes (Signed)
 Wound cleansed with wound cleanser and sterile saline, pt tolerated well. LET placed on L ankle wound.
